# Patient Record
Sex: Male | Born: 1955
Health system: Southern US, Community
[De-identification: ages and names within clinical notes are randomized; demographics above are authoritative.]

## PROBLEM LIST (undated history)

## (undated) DIAGNOSIS — I10 Essential (primary) hypertension: Secondary | ICD-10-CM

## (undated) DIAGNOSIS — C801 Malignant (primary) neoplasm, unspecified: Secondary | ICD-10-CM

## (undated) DIAGNOSIS — I4892 Unspecified atrial flutter: Secondary | ICD-10-CM

## (undated) DIAGNOSIS — M1990 Unspecified osteoarthritis, unspecified site: Secondary | ICD-10-CM

## (undated) DIAGNOSIS — F32A Depression, unspecified: Secondary | ICD-10-CM

## (undated) DIAGNOSIS — F329 Major depressive disorder, single episode, unspecified: Secondary | ICD-10-CM

## (undated) HISTORY — PX: SKIN CANCER EXCISION: SHX779

## (undated) HISTORY — PX: COLONOSCOPY W/ POLYPECTOMY: SHX1380

---

## 2015-06-02 DIAGNOSIS — L570 Actinic keratosis: Secondary | ICD-10-CM | POA: Diagnosis not present

## 2015-06-02 DIAGNOSIS — C4441 Basal cell carcinoma of skin of scalp and neck: Secondary | ICD-10-CM | POA: Diagnosis not present

## 2015-06-02 DIAGNOSIS — C44319 Basal cell carcinoma of skin of other parts of face: Secondary | ICD-10-CM | POA: Diagnosis not present

## 2015-06-02 DIAGNOSIS — L301 Dyshidrosis [pompholyx]: Secondary | ICD-10-CM | POA: Diagnosis not present

## 2015-06-17 DIAGNOSIS — E291 Testicular hypofunction: Secondary | ICD-10-CM | POA: Diagnosis not present

## 2015-06-17 DIAGNOSIS — J019 Acute sinusitis, unspecified: Secondary | ICD-10-CM | POA: Diagnosis not present

## 2015-06-17 DIAGNOSIS — I10 Essential (primary) hypertension: Secondary | ICD-10-CM | POA: Diagnosis not present

## 2015-06-17 DIAGNOSIS — M5431 Sciatica, right side: Secondary | ICD-10-CM | POA: Diagnosis not present

## 2015-06-17 DIAGNOSIS — M545 Low back pain: Secondary | ICD-10-CM | POA: Diagnosis not present

## 2015-06-17 DIAGNOSIS — F329 Major depressive disorder, single episode, unspecified: Secondary | ICD-10-CM | POA: Diagnosis not present

## 2015-07-16 DIAGNOSIS — E291 Testicular hypofunction: Secondary | ICD-10-CM | POA: Diagnosis not present

## 2015-07-16 DIAGNOSIS — E782 Mixed hyperlipidemia: Secondary | ICD-10-CM | POA: Diagnosis not present

## 2015-07-16 DIAGNOSIS — R252 Cramp and spasm: Secondary | ICD-10-CM | POA: Diagnosis not present

## 2015-07-16 DIAGNOSIS — R5381 Other malaise: Secondary | ICD-10-CM | POA: Diagnosis not present

## 2015-07-16 DIAGNOSIS — E1165 Type 2 diabetes mellitus with hyperglycemia: Secondary | ICD-10-CM | POA: Diagnosis not present

## 2015-07-16 DIAGNOSIS — B179 Acute viral hepatitis, unspecified: Secondary | ICD-10-CM | POA: Diagnosis not present

## 2015-07-16 DIAGNOSIS — I10 Essential (primary) hypertension: Secondary | ICD-10-CM | POA: Diagnosis not present

## 2015-07-16 DIAGNOSIS — R947 Abnormal results of other endocrine function studies: Secondary | ICD-10-CM | POA: Diagnosis not present

## 2015-07-16 DIAGNOSIS — J019 Acute sinusitis, unspecified: Secondary | ICD-10-CM | POA: Diagnosis not present

## 2015-07-16 DIAGNOSIS — Z79891 Long term (current) use of opiate analgesic: Secondary | ICD-10-CM | POA: Diagnosis not present

## 2015-07-16 DIAGNOSIS — E790 Hyperuricemia without signs of inflammatory arthritis and tophaceous disease: Secondary | ICD-10-CM | POA: Diagnosis not present

## 2015-07-16 DIAGNOSIS — R7989 Other specified abnormal findings of blood chemistry: Secondary | ICD-10-CM | POA: Diagnosis not present

## 2015-07-16 DIAGNOSIS — F11220 Opioid dependence with intoxication, uncomplicated: Secondary | ICD-10-CM | POA: Diagnosis not present

## 2015-07-16 DIAGNOSIS — E039 Hypothyroidism, unspecified: Secondary | ICD-10-CM | POA: Diagnosis not present

## 2015-07-16 DIAGNOSIS — G8929 Other chronic pain: Secondary | ICD-10-CM | POA: Diagnosis not present

## 2015-08-13 DIAGNOSIS — G8929 Other chronic pain: Secondary | ICD-10-CM | POA: Diagnosis not present

## 2015-08-13 DIAGNOSIS — F11220 Opioid dependence with intoxication, uncomplicated: Secondary | ICD-10-CM | POA: Diagnosis not present

## 2015-08-13 DIAGNOSIS — E291 Testicular hypofunction: Secondary | ICD-10-CM | POA: Diagnosis not present

## 2015-08-13 DIAGNOSIS — F17210 Nicotine dependence, cigarettes, uncomplicated: Secondary | ICD-10-CM | POA: Diagnosis not present

## 2015-08-13 DIAGNOSIS — B1920 Unspecified viral hepatitis C without hepatic coma: Secondary | ICD-10-CM | POA: Diagnosis not present

## 2015-08-13 DIAGNOSIS — F1190 Opioid use, unspecified, uncomplicated: Secondary | ICD-10-CM | POA: Diagnosis not present

## 2015-08-13 DIAGNOSIS — I10 Essential (primary) hypertension: Secondary | ICD-10-CM | POA: Diagnosis not present

## 2015-08-13 DIAGNOSIS — Z79891 Long term (current) use of opiate analgesic: Secondary | ICD-10-CM | POA: Diagnosis not present

## 2015-09-11 DIAGNOSIS — W64XXXA Exposure to other animate mechanical forces, initial encounter: Secondary | ICD-10-CM | POA: Diagnosis not present

## 2015-09-11 DIAGNOSIS — E291 Testicular hypofunction: Secondary | ICD-10-CM | POA: Diagnosis not present

## 2015-09-11 DIAGNOSIS — F11220 Opioid dependence with intoxication, uncomplicated: Secondary | ICD-10-CM | POA: Diagnosis not present

## 2015-09-11 DIAGNOSIS — R5383 Other fatigue: Secondary | ICD-10-CM | POA: Diagnosis not present

## 2015-09-11 DIAGNOSIS — G8929 Other chronic pain: Secondary | ICD-10-CM | POA: Diagnosis not present

## 2015-09-11 DIAGNOSIS — F1190 Opioid use, unspecified, uncomplicated: Secondary | ICD-10-CM | POA: Diagnosis not present

## 2015-09-11 DIAGNOSIS — F17210 Nicotine dependence, cigarettes, uncomplicated: Secondary | ICD-10-CM | POA: Diagnosis not present

## 2015-09-11 DIAGNOSIS — I10 Essential (primary) hypertension: Secondary | ICD-10-CM | POA: Diagnosis not present

## 2015-09-11 DIAGNOSIS — Z79891 Long term (current) use of opiate analgesic: Secondary | ICD-10-CM | POA: Diagnosis not present

## 2015-09-17 DIAGNOSIS — A6920 Lyme disease, unspecified: Secondary | ICD-10-CM | POA: Diagnosis not present

## 2015-10-09 DIAGNOSIS — F11220 Opioid dependence with intoxication, uncomplicated: Secondary | ICD-10-CM | POA: Diagnosis not present

## 2015-10-09 DIAGNOSIS — I10 Essential (primary) hypertension: Secondary | ICD-10-CM | POA: Diagnosis not present

## 2015-10-09 DIAGNOSIS — Z5181 Encounter for therapeutic drug level monitoring: Secondary | ICD-10-CM | POA: Diagnosis not present

## 2015-10-09 DIAGNOSIS — R000 Tachycardia, unspecified: Secondary | ICD-10-CM | POA: Diagnosis not present

## 2015-10-09 DIAGNOSIS — I483 Typical atrial flutter: Secondary | ICD-10-CM | POA: Diagnosis not present

## 2015-10-09 DIAGNOSIS — F17210 Nicotine dependence, cigarettes, uncomplicated: Secondary | ICD-10-CM | POA: Diagnosis not present

## 2015-10-09 DIAGNOSIS — Z79891 Long term (current) use of opiate analgesic: Secondary | ICD-10-CM | POA: Diagnosis not present

## 2015-10-09 DIAGNOSIS — R002 Palpitations: Secondary | ICD-10-CM | POA: Diagnosis not present

## 2015-10-09 DIAGNOSIS — G8929 Other chronic pain: Secondary | ICD-10-CM | POA: Diagnosis not present

## 2015-10-13 DIAGNOSIS — E291 Testicular hypofunction: Secondary | ICD-10-CM | POA: Diagnosis not present

## 2015-11-13 DIAGNOSIS — G8929 Other chronic pain: Secondary | ICD-10-CM | POA: Diagnosis not present

## 2015-11-13 DIAGNOSIS — Z79891 Long term (current) use of opiate analgesic: Secondary | ICD-10-CM | POA: Diagnosis not present

## 2015-11-13 DIAGNOSIS — E291 Testicular hypofunction: Secondary | ICD-10-CM | POA: Diagnosis not present

## 2015-11-13 DIAGNOSIS — F1190 Opioid use, unspecified, uncomplicated: Secondary | ICD-10-CM | POA: Diagnosis not present

## 2015-11-13 DIAGNOSIS — I10 Essential (primary) hypertension: Secondary | ICD-10-CM | POA: Diagnosis not present

## 2015-11-13 DIAGNOSIS — F11220 Opioid dependence with intoxication, uncomplicated: Secondary | ICD-10-CM | POA: Diagnosis not present

## 2015-11-30 DIAGNOSIS — C44719 Basal cell carcinoma of skin of left lower limb, including hip: Secondary | ICD-10-CM | POA: Diagnosis not present

## 2015-11-30 DIAGNOSIS — L301 Dyshidrosis [pompholyx]: Secondary | ICD-10-CM | POA: Diagnosis not present

## 2015-11-30 DIAGNOSIS — L570 Actinic keratosis: Secondary | ICD-10-CM | POA: Diagnosis not present

## 2015-12-11 DIAGNOSIS — G8929 Other chronic pain: Secondary | ICD-10-CM | POA: Diagnosis not present

## 2015-12-11 DIAGNOSIS — Z5181 Encounter for therapeutic drug level monitoring: Secondary | ICD-10-CM | POA: Diagnosis not present

## 2015-12-11 DIAGNOSIS — I10 Essential (primary) hypertension: Secondary | ICD-10-CM | POA: Diagnosis not present

## 2015-12-11 DIAGNOSIS — R252 Cramp and spasm: Secondary | ICD-10-CM | POA: Diagnosis not present

## 2015-12-11 DIAGNOSIS — E559 Vitamin D deficiency, unspecified: Secondary | ICD-10-CM | POA: Diagnosis not present

## 2015-12-11 DIAGNOSIS — E790 Hyperuricemia without signs of inflammatory arthritis and tophaceous disease: Secondary | ICD-10-CM | POA: Diagnosis not present

## 2015-12-11 DIAGNOSIS — E782 Mixed hyperlipidemia: Secondary | ICD-10-CM | POA: Diagnosis not present

## 2015-12-11 DIAGNOSIS — E291 Testicular hypofunction: Secondary | ICD-10-CM | POA: Diagnosis not present

## 2015-12-11 DIAGNOSIS — E039 Hypothyroidism, unspecified: Secondary | ICD-10-CM | POA: Diagnosis not present

## 2015-12-11 DIAGNOSIS — F1190 Opioid use, unspecified, uncomplicated: Secondary | ICD-10-CM | POA: Diagnosis not present

## 2015-12-11 DIAGNOSIS — E1165 Type 2 diabetes mellitus with hyperglycemia: Secondary | ICD-10-CM | POA: Diagnosis not present

## 2016-01-08 DIAGNOSIS — F1190 Opioid use, unspecified, uncomplicated: Secondary | ICD-10-CM | POA: Diagnosis not present

## 2016-01-08 DIAGNOSIS — E291 Testicular hypofunction: Secondary | ICD-10-CM | POA: Diagnosis not present

## 2016-01-08 DIAGNOSIS — F17210 Nicotine dependence, cigarettes, uncomplicated: Secondary | ICD-10-CM | POA: Diagnosis not present

## 2016-01-08 DIAGNOSIS — F11220 Opioid dependence with intoxication, uncomplicated: Secondary | ICD-10-CM | POA: Diagnosis not present

## 2016-01-08 DIAGNOSIS — Z79891 Long term (current) use of opiate analgesic: Secondary | ICD-10-CM | POA: Diagnosis not present

## 2016-01-08 DIAGNOSIS — I10 Essential (primary) hypertension: Secondary | ICD-10-CM | POA: Diagnosis not present

## 2016-01-08 DIAGNOSIS — G8929 Other chronic pain: Secondary | ICD-10-CM | POA: Diagnosis not present

## 2016-02-08 DIAGNOSIS — I10 Essential (primary) hypertension: Secondary | ICD-10-CM | POA: Diagnosis not present

## 2016-02-08 DIAGNOSIS — G8929 Other chronic pain: Secondary | ICD-10-CM | POA: Diagnosis not present

## 2016-02-08 DIAGNOSIS — Z5181 Encounter for therapeutic drug level monitoring: Secondary | ICD-10-CM | POA: Diagnosis not present

## 2016-02-08 DIAGNOSIS — E291 Testicular hypofunction: Secondary | ICD-10-CM | POA: Diagnosis not present

## 2016-02-08 DIAGNOSIS — F1190 Opioid use, unspecified, uncomplicated: Secondary | ICD-10-CM | POA: Diagnosis not present

## 2016-03-07 DIAGNOSIS — F11220 Opioid dependence with intoxication, uncomplicated: Secondary | ICD-10-CM | POA: Diagnosis not present

## 2016-03-07 DIAGNOSIS — I10 Essential (primary) hypertension: Secondary | ICD-10-CM | POA: Diagnosis not present

## 2016-03-07 DIAGNOSIS — Z79891 Long term (current) use of opiate analgesic: Secondary | ICD-10-CM | POA: Diagnosis not present

## 2016-03-07 DIAGNOSIS — E291 Testicular hypofunction: Secondary | ICD-10-CM | POA: Diagnosis not present

## 2016-03-07 DIAGNOSIS — Z5181 Encounter for therapeutic drug level monitoring: Secondary | ICD-10-CM | POA: Diagnosis not present

## 2016-03-07 DIAGNOSIS — E559 Vitamin D deficiency, unspecified: Secondary | ICD-10-CM | POA: Diagnosis not present

## 2016-03-07 DIAGNOSIS — F1190 Opioid use, unspecified, uncomplicated: Secondary | ICD-10-CM | POA: Diagnosis not present

## 2016-03-07 DIAGNOSIS — E039 Hypothyroidism, unspecified: Secondary | ICD-10-CM | POA: Diagnosis not present

## 2016-03-07 DIAGNOSIS — E782 Mixed hyperlipidemia: Secondary | ICD-10-CM | POA: Diagnosis not present

## 2016-03-07 DIAGNOSIS — E1165 Type 2 diabetes mellitus with hyperglycemia: Secondary | ICD-10-CM | POA: Diagnosis not present

## 2016-03-07 DIAGNOSIS — E790 Hyperuricemia without signs of inflammatory arthritis and tophaceous disease: Secondary | ICD-10-CM | POA: Diagnosis not present

## 2016-03-07 DIAGNOSIS — G8929 Other chronic pain: Secondary | ICD-10-CM | POA: Diagnosis not present

## 2016-03-15 DIAGNOSIS — C44529 Squamous cell carcinoma of skin of other part of trunk: Secondary | ICD-10-CM | POA: Diagnosis not present

## 2016-03-15 DIAGNOSIS — L570 Actinic keratosis: Secondary | ICD-10-CM | POA: Diagnosis not present

## 2016-03-15 DIAGNOSIS — L301 Dyshidrosis [pompholyx]: Secondary | ICD-10-CM | POA: Diagnosis not present

## 2016-03-15 DIAGNOSIS — L578 Other skin changes due to chronic exposure to nonionizing radiation: Secondary | ICD-10-CM | POA: Diagnosis not present

## 2016-03-15 DIAGNOSIS — C44719 Basal cell carcinoma of skin of left lower limb, including hip: Secondary | ICD-10-CM | POA: Diagnosis not present

## 2016-03-24 DIAGNOSIS — C44519 Basal cell carcinoma of skin of other part of trunk: Secondary | ICD-10-CM | POA: Diagnosis not present

## 2016-03-29 DIAGNOSIS — I483 Typical atrial flutter: Secondary | ICD-10-CM | POA: Diagnosis not present

## 2016-03-29 DIAGNOSIS — I110 Hypertensive heart disease with heart failure: Secondary | ICD-10-CM | POA: Diagnosis not present

## 2016-04-05 DIAGNOSIS — I519 Heart disease, unspecified: Secondary | ICD-10-CM | POA: Diagnosis not present

## 2016-04-05 DIAGNOSIS — E291 Testicular hypofunction: Secondary | ICD-10-CM | POA: Diagnosis not present

## 2016-04-05 DIAGNOSIS — G8929 Other chronic pain: Secondary | ICD-10-CM | POA: Diagnosis not present

## 2016-04-05 DIAGNOSIS — F1190 Opioid use, unspecified, uncomplicated: Secondary | ICD-10-CM | POA: Diagnosis not present

## 2016-04-05 DIAGNOSIS — Z5181 Encounter for therapeutic drug level monitoring: Secondary | ICD-10-CM | POA: Diagnosis not present

## 2016-04-05 DIAGNOSIS — I10 Essential (primary) hypertension: Secondary | ICD-10-CM | POA: Diagnosis not present

## 2016-05-03 DIAGNOSIS — F1190 Opioid use, unspecified, uncomplicated: Secondary | ICD-10-CM | POA: Diagnosis not present

## 2016-05-03 DIAGNOSIS — I10 Essential (primary) hypertension: Secondary | ICD-10-CM | POA: Diagnosis not present

## 2016-05-03 DIAGNOSIS — Z5181 Encounter for therapeutic drug level monitoring: Secondary | ICD-10-CM | POA: Diagnosis not present

## 2016-05-03 DIAGNOSIS — E291 Testicular hypofunction: Secondary | ICD-10-CM | POA: Diagnosis not present

## 2016-05-03 DIAGNOSIS — G8929 Other chronic pain: Secondary | ICD-10-CM | POA: Diagnosis not present

## 2016-05-03 DIAGNOSIS — F11220 Opioid dependence with intoxication, uncomplicated: Secondary | ICD-10-CM | POA: Diagnosis not present

## 2016-05-03 DIAGNOSIS — Z79891 Long term (current) use of opiate analgesic: Secondary | ICD-10-CM | POA: Diagnosis not present

## 2016-05-20 DIAGNOSIS — J189 Pneumonia, unspecified organism: Secondary | ICD-10-CM | POA: Diagnosis not present

## 2016-06-02 DIAGNOSIS — I519 Heart disease, unspecified: Secondary | ICD-10-CM | POA: Diagnosis not present

## 2016-06-02 DIAGNOSIS — F17210 Nicotine dependence, cigarettes, uncomplicated: Secondary | ICD-10-CM | POA: Diagnosis not present

## 2016-06-02 DIAGNOSIS — E790 Hyperuricemia without signs of inflammatory arthritis and tophaceous disease: Secondary | ICD-10-CM | POA: Diagnosis not present

## 2016-06-02 DIAGNOSIS — E1165 Type 2 diabetes mellitus with hyperglycemia: Secondary | ICD-10-CM | POA: Diagnosis not present

## 2016-06-02 DIAGNOSIS — E039 Hypothyroidism, unspecified: Secondary | ICD-10-CM | POA: Diagnosis not present

## 2016-06-02 DIAGNOSIS — F11220 Opioid dependence with intoxication, uncomplicated: Secondary | ICD-10-CM | POA: Diagnosis not present

## 2016-06-02 DIAGNOSIS — E559 Vitamin D deficiency, unspecified: Secondary | ICD-10-CM | POA: Diagnosis not present

## 2016-06-02 DIAGNOSIS — F1190 Opioid use, unspecified, uncomplicated: Secondary | ICD-10-CM | POA: Diagnosis not present

## 2016-06-02 DIAGNOSIS — Z5181 Encounter for therapeutic drug level monitoring: Secondary | ICD-10-CM | POA: Diagnosis not present

## 2016-06-02 DIAGNOSIS — I10 Essential (primary) hypertension: Secondary | ICD-10-CM | POA: Diagnosis not present

## 2016-06-02 DIAGNOSIS — E782 Mixed hyperlipidemia: Secondary | ICD-10-CM | POA: Diagnosis not present

## 2016-06-02 DIAGNOSIS — Z79891 Long term (current) use of opiate analgesic: Secondary | ICD-10-CM | POA: Diagnosis not present

## 2016-06-02 DIAGNOSIS — E291 Testicular hypofunction: Secondary | ICD-10-CM | POA: Diagnosis not present

## 2016-06-02 DIAGNOSIS — G8929 Other chronic pain: Secondary | ICD-10-CM | POA: Diagnosis not present

## 2016-07-04 DIAGNOSIS — F1190 Opioid use, unspecified, uncomplicated: Secondary | ICD-10-CM | POA: Diagnosis not present

## 2016-07-04 DIAGNOSIS — Z79891 Long term (current) use of opiate analgesic: Secondary | ICD-10-CM | POA: Diagnosis not present

## 2016-07-04 DIAGNOSIS — I10 Essential (primary) hypertension: Secondary | ICD-10-CM | POA: Diagnosis not present

## 2016-07-04 DIAGNOSIS — Z5181 Encounter for therapeutic drug level monitoring: Secondary | ICD-10-CM | POA: Diagnosis not present

## 2016-07-04 DIAGNOSIS — G8929 Other chronic pain: Secondary | ICD-10-CM | POA: Diagnosis not present

## 2016-07-04 DIAGNOSIS — E291 Testicular hypofunction: Secondary | ICD-10-CM | POA: Diagnosis not present

## 2016-07-04 DIAGNOSIS — F11220 Opioid dependence with intoxication, uncomplicated: Secondary | ICD-10-CM | POA: Diagnosis not present

## 2016-07-04 DIAGNOSIS — F17210 Nicotine dependence, cigarettes, uncomplicated: Secondary | ICD-10-CM | POA: Diagnosis not present

## 2016-07-25 DIAGNOSIS — C44629 Squamous cell carcinoma of skin of left upper limb, including shoulder: Secondary | ICD-10-CM | POA: Diagnosis not present

## 2016-07-25 DIAGNOSIS — D0462 Carcinoma in situ of skin of left upper limb, including shoulder: Secondary | ICD-10-CM | POA: Diagnosis not present

## 2016-07-25 DIAGNOSIS — L570 Actinic keratosis: Secondary | ICD-10-CM | POA: Diagnosis not present

## 2016-08-01 DIAGNOSIS — M5441 Lumbago with sciatica, right side: Secondary | ICD-10-CM | POA: Diagnosis not present

## 2016-08-01 DIAGNOSIS — E291 Testicular hypofunction: Secondary | ICD-10-CM | POA: Diagnosis not present

## 2016-08-01 DIAGNOSIS — I10 Essential (primary) hypertension: Secondary | ICD-10-CM | POA: Diagnosis not present

## 2016-08-01 DIAGNOSIS — G8929 Other chronic pain: Secondary | ICD-10-CM | POA: Diagnosis not present

## 2016-08-01 DIAGNOSIS — Z5181 Encounter for therapeutic drug level monitoring: Secondary | ICD-10-CM | POA: Diagnosis not present

## 2016-08-01 DIAGNOSIS — F1190 Opioid use, unspecified, uncomplicated: Secondary | ICD-10-CM | POA: Diagnosis not present

## 2016-08-08 DIAGNOSIS — L570 Actinic keratosis: Secondary | ICD-10-CM | POA: Diagnosis not present

## 2016-08-29 DIAGNOSIS — C44629 Squamous cell carcinoma of skin of left upper limb, including shoulder: Secondary | ICD-10-CM | POA: Diagnosis not present

## 2016-09-01 DIAGNOSIS — E291 Testicular hypofunction: Secondary | ICD-10-CM | POA: Diagnosis not present

## 2016-09-01 DIAGNOSIS — F1190 Opioid use, unspecified, uncomplicated: Secondary | ICD-10-CM | POA: Diagnosis not present

## 2016-09-01 DIAGNOSIS — R05 Cough: Secondary | ICD-10-CM | POA: Diagnosis not present

## 2016-09-01 DIAGNOSIS — G8929 Other chronic pain: Secondary | ICD-10-CM | POA: Diagnosis not present

## 2016-09-01 DIAGNOSIS — E782 Mixed hyperlipidemia: Secondary | ICD-10-CM | POA: Diagnosis not present

## 2016-09-01 DIAGNOSIS — E039 Hypothyroidism, unspecified: Secondary | ICD-10-CM | POA: Diagnosis not present

## 2016-09-01 DIAGNOSIS — E790 Hyperuricemia without signs of inflammatory arthritis and tophaceous disease: Secondary | ICD-10-CM | POA: Diagnosis not present

## 2016-09-01 DIAGNOSIS — E1165 Type 2 diabetes mellitus with hyperglycemia: Secondary | ICD-10-CM | POA: Diagnosis not present

## 2016-09-01 DIAGNOSIS — F17210 Nicotine dependence, cigarettes, uncomplicated: Secondary | ICD-10-CM | POA: Diagnosis not present

## 2016-09-01 DIAGNOSIS — E559 Vitamin D deficiency, unspecified: Secondary | ICD-10-CM | POA: Diagnosis not present

## 2016-09-01 DIAGNOSIS — I10 Essential (primary) hypertension: Secondary | ICD-10-CM | POA: Diagnosis not present

## 2016-09-20 DIAGNOSIS — M549 Dorsalgia, unspecified: Secondary | ICD-10-CM | POA: Diagnosis not present

## 2016-09-20 DIAGNOSIS — M48062 Spinal stenosis, lumbar region with neurogenic claudication: Secondary | ICD-10-CM | POA: Diagnosis not present

## 2016-09-20 DIAGNOSIS — M4316 Spondylolisthesis, lumbar region: Secondary | ICD-10-CM | POA: Diagnosis not present

## 2016-09-20 DIAGNOSIS — I10 Essential (primary) hypertension: Secondary | ICD-10-CM | POA: Diagnosis not present

## 2016-10-10 DIAGNOSIS — F411 Generalized anxiety disorder: Secondary | ICD-10-CM | POA: Diagnosis not present

## 2016-10-10 DIAGNOSIS — I10 Essential (primary) hypertension: Secondary | ICD-10-CM | POA: Diagnosis not present

## 2016-10-10 DIAGNOSIS — G8929 Other chronic pain: Secondary | ICD-10-CM | POA: Diagnosis not present

## 2016-10-10 DIAGNOSIS — E785 Hyperlipidemia, unspecified: Secondary | ICD-10-CM | POA: Diagnosis not present

## 2016-10-10 DIAGNOSIS — F1190 Opioid use, unspecified, uncomplicated: Secondary | ICD-10-CM | POA: Diagnosis not present

## 2016-10-27 DIAGNOSIS — I10 Essential (primary) hypertension: Secondary | ICD-10-CM | POA: Diagnosis not present

## 2016-10-27 DIAGNOSIS — I4892 Unspecified atrial flutter: Secondary | ICD-10-CM | POA: Diagnosis not present

## 2016-10-31 DIAGNOSIS — L570 Actinic keratosis: Secondary | ICD-10-CM | POA: Diagnosis not present

## 2016-10-31 DIAGNOSIS — M48062 Spinal stenosis, lumbar region with neurogenic claudication: Secondary | ICD-10-CM | POA: Diagnosis not present

## 2016-10-31 DIAGNOSIS — M545 Low back pain: Secondary | ICD-10-CM | POA: Diagnosis not present

## 2016-10-31 DIAGNOSIS — M5136 Other intervertebral disc degeneration, lumbar region: Secondary | ICD-10-CM | POA: Diagnosis not present

## 2016-11-10 DIAGNOSIS — I10 Essential (primary) hypertension: Secondary | ICD-10-CM | POA: Diagnosis not present

## 2016-11-10 DIAGNOSIS — F17210 Nicotine dependence, cigarettes, uncomplicated: Secondary | ICD-10-CM | POA: Diagnosis not present

## 2016-11-10 DIAGNOSIS — F11220 Opioid dependence with intoxication, uncomplicated: Secondary | ICD-10-CM | POA: Diagnosis not present

## 2016-11-10 DIAGNOSIS — G8929 Other chronic pain: Secondary | ICD-10-CM | POA: Diagnosis not present

## 2016-11-10 DIAGNOSIS — E291 Testicular hypofunction: Secondary | ICD-10-CM | POA: Diagnosis not present

## 2016-11-10 DIAGNOSIS — Z79891 Long term (current) use of opiate analgesic: Secondary | ICD-10-CM | POA: Diagnosis not present

## 2016-11-10 DIAGNOSIS — F1190 Opioid use, unspecified, uncomplicated: Secondary | ICD-10-CM | POA: Diagnosis not present

## 2016-12-09 DIAGNOSIS — I519 Heart disease, unspecified: Secondary | ICD-10-CM | POA: Diagnosis not present

## 2016-12-09 DIAGNOSIS — G8929 Other chronic pain: Secondary | ICD-10-CM | POA: Diagnosis not present

## 2016-12-09 DIAGNOSIS — F1190 Opioid use, unspecified, uncomplicated: Secondary | ICD-10-CM | POA: Diagnosis not present

## 2016-12-09 DIAGNOSIS — Z5181 Encounter for therapeutic drug level monitoring: Secondary | ICD-10-CM | POA: Diagnosis not present

## 2016-12-09 DIAGNOSIS — F17210 Nicotine dependence, cigarettes, uncomplicated: Secondary | ICD-10-CM | POA: Diagnosis not present

## 2016-12-09 DIAGNOSIS — I10 Essential (primary) hypertension: Secondary | ICD-10-CM | POA: Diagnosis not present

## 2017-01-09 ENCOUNTER — Other Ambulatory Visit: Payer: Self-pay | Admitting: Neurosurgery

## 2017-01-11 DIAGNOSIS — G8929 Other chronic pain: Secondary | ICD-10-CM | POA: Diagnosis not present

## 2017-01-11 DIAGNOSIS — F17210 Nicotine dependence, cigarettes, uncomplicated: Secondary | ICD-10-CM | POA: Diagnosis not present

## 2017-01-11 DIAGNOSIS — E291 Testicular hypofunction: Secondary | ICD-10-CM | POA: Diagnosis not present

## 2017-01-11 DIAGNOSIS — F1190 Opioid use, unspecified, uncomplicated: Secondary | ICD-10-CM | POA: Diagnosis not present

## 2017-01-11 DIAGNOSIS — Z5181 Encounter for therapeutic drug level monitoring: Secondary | ICD-10-CM | POA: Diagnosis not present

## 2017-01-11 DIAGNOSIS — I10 Essential (primary) hypertension: Secondary | ICD-10-CM | POA: Diagnosis not present

## 2017-02-10 DIAGNOSIS — I10 Essential (primary) hypertension: Secondary | ICD-10-CM | POA: Diagnosis not present

## 2017-02-10 DIAGNOSIS — Z1389 Encounter for screening for other disorder: Secondary | ICD-10-CM | POA: Diagnosis not present

## 2017-02-10 DIAGNOSIS — Z5181 Encounter for therapeutic drug level monitoring: Secondary | ICD-10-CM | POA: Diagnosis not present

## 2017-02-10 DIAGNOSIS — E291 Testicular hypofunction: Secondary | ICD-10-CM | POA: Diagnosis not present

## 2017-02-10 DIAGNOSIS — F1190 Opioid use, unspecified, uncomplicated: Secondary | ICD-10-CM | POA: Diagnosis not present

## 2017-02-10 DIAGNOSIS — G8929 Other chronic pain: Secondary | ICD-10-CM | POA: Diagnosis not present

## 2017-02-10 DIAGNOSIS — F411 Generalized anxiety disorder: Secondary | ICD-10-CM | POA: Diagnosis not present

## 2017-02-10 DIAGNOSIS — F17210 Nicotine dependence, cigarettes, uncomplicated: Secondary | ICD-10-CM | POA: Diagnosis not present

## 2017-02-10 DIAGNOSIS — M5137 Other intervertebral disc degeneration, lumbosacral region: Secondary | ICD-10-CM | POA: Diagnosis not present

## 2017-02-22 NOTE — Pre-Procedure Instructions (Signed)
Bryan Robinson  02/22/2017     No Pharmacies Listed   Your procedure is scheduled on Wed. Oct. 10  Report to [facility] Admitting at 10:15 A.M.  Call this number if you have problems the morning of surgery:  [phone]   Remember:  Do not eat food or drink liquids after midnight on Oct. 9   Take these medicines the morning of surgery with A SIP OF WATER              7 days prior to surgery STOP taking any Aspirin (unless otherwise instructed by your surgeon), Aleve, Naproxen, Ibuprofen, Motrin, Advil, Goody's, BC's, all herbal medications, fish oil, and all vitamins    Do not wear jewelry.  Do not wear lotions, powders, or perfumes, or deoderant.  Do not shave 48 hours prior to surgery.  Men may shave face and neck.  Do not bring valuables to the hospital.  [facility] is not responsible for any belongings or valuables.  Contacts, dentures or bridgework may not be worn into surgery.  Leave your suitcase in the car.  After surgery it may be brought to your room.  For patients admitted to the hospital, discharge time will be determined by your treatment team.  Patients discharged the day of surgery will not be allowed to drive home.    Special instructions:  [place]- Preparing For Surgery  Before surgery, you can play an important role. Because skin is not sterile, your skin needs to be as free of germs as possible. You can reduce the number of germs on your skin by washing with CHG (chlorahexidine gluconate) Soap before surgery.  CHG is an antiseptic cleaner which kills germs and bonds with the skin to continue killing germs even after washing.  Please do not use if you have an allergy to CHG or antibacterial soaps. If your skin becomes reddened/irritated stop using the CHG.  Do not shave (including legs and underarms) for at least 48 hours prior to first CHG shower. It is OK to shave your face.  Please follow these instructions carefully.   1. Shower the  NIGHT BEFORE SURGERY and the MORNING OF SURGERY with CHG.   2. If you chose to wash your hair, wash your hair first as usual with your normal shampoo.  3. After you shampoo, rinse your hair and body thoroughly to remove the shampoo.  4. Use CHG as you would any other liquid soap. You can apply CHG directly to the skin and wash gently with a scrungie or a clean washcloth.   5. Apply the CHG Soap to your body ONLY FROM THE NECK DOWN.  Do not use on open wounds or open sores. Avoid contact with your eyes, ears, mouth and genitals (private parts). Wash genitals (private parts) with your normal soap.  USE REGULAR SHAMPOO AND CONDITIONER FOR HAIR USE REGULAR SOAP FOR FACE AND PRIVATE AREA  6. Wash thoroughly, paying special attention to the area where your surgery will be performed.  7. Thoroughly rinse your body with warm water from the neck down.  8. DO NOT shower/wash with your normal soap after using and rinsing off the CHG Soap.  9. Pat yourself dry with a CLEAN TOWEL and [place] CLOTH  10. Wear CLEAN PAJAMAS to bed the night before surgery, wear comfortable clothes the morning of surgery  11. Place CLEAN SHEETS on your bed the night of your first shower and DO NOT SLEEP WITH PETS.  Day of Surgery: Do not apply any deodorants/lotions. Please wear clean clothes to the hospital/surgery center.      Please read over the following fact sheets that you were given. Coughing and Deep Breathing, MRSA Information and Surgical Site Infection Prevention

## 2017-02-23 ENCOUNTER — Encounter (HOSPITAL_COMMUNITY)
Admission: RE | Admit: 2017-02-23 | Discharge: 2017-02-23 | Disposition: A | Discharge status: Home / Self Care | Payer: PPO | Source: Ambulatory Visit | Attending: Neurosurgery | Admitting: Neurosurgery

## 2017-02-23 ENCOUNTER — Encounter (HOSPITAL_COMMUNITY): Payer: Self-pay

## 2017-02-23 DIAGNOSIS — Z01812 Encounter for preprocedural laboratory examination: Secondary | ICD-10-CM | POA: Diagnosis not present

## 2017-02-23 DIAGNOSIS — F329 Major depressive disorder, single episode, unspecified: Secondary | ICD-10-CM | POA: Insufficient documentation

## 2017-02-23 DIAGNOSIS — I10 Essential (primary) hypertension: Secondary | ICD-10-CM | POA: Diagnosis not present

## 2017-02-23 DIAGNOSIS — Z85828 Personal history of other malignant neoplasm of skin: Secondary | ICD-10-CM | POA: Insufficient documentation

## 2017-02-23 DIAGNOSIS — M1990 Unspecified osteoarthritis, unspecified site: Secondary | ICD-10-CM | POA: Diagnosis not present

## 2017-02-23 DIAGNOSIS — M4316 Spondylolisthesis, lumbar region: Secondary | ICD-10-CM | POA: Diagnosis not present

## 2017-02-23 HISTORY — DX: Unspecified atrial flutter: I48.92

## 2017-02-23 HISTORY — DX: Depression, unspecified: F32.A

## 2017-02-23 HISTORY — DX: Malignant (primary) neoplasm, unspecified: C80.1

## 2017-02-23 HISTORY — DX: Essential (primary) hypertension: I10

## 2017-02-23 HISTORY — DX: Unspecified osteoarthritis, unspecified site: M19.90

## 2017-02-23 HISTORY — DX: Major depressive disorder, single episode, unspecified: F32.9

## 2017-02-23 LAB — TYPE AND SCREEN
ABO/RH(D): B POS
ANTIBODY SCREEN: NEGATIVE

## 2017-02-23 LAB — SURGICAL PCR SCREEN
MRSA, PCR: NEGATIVE
Staphylococcus aureus: NEGATIVE

## 2017-02-23 LAB — BASIC METABOLIC PANEL
Anion gap: 11 (ref 5–15)
BUN: 6 mg/dL (ref 6–20)
CHLORIDE: 97 mmol/L — AB (ref 101–111)
CO2: 27 mmol/L (ref 22–32)
CREATININE: 1.07 mg/dL (ref 0.61–1.24)
Calcium: 9.1 mg/dL (ref 8.9–10.3)
GFR calc Af Amer: >60 mL/min (ref >60)
GFR calc non Af Amer: >60 mL/min (ref >60)
Glucose, Bld: 92 mg/dL (ref 65–99)
POTASSIUM: 4.1 mmol/L (ref 3.5–5.1)
SODIUM: 135 mmol/L (ref 135–145)

## 2017-02-23 LAB — CBC
HEMATOCRIT: 43.7 % (ref 39.0–52.0)
HEMOGLOBIN: 14.9 g/dL (ref 13.0–17.0)
MCH: 30.3 pg (ref 26.0–34.0)
MCHC: 34.1 g/dL (ref 30.0–36.0)
MCV: 89 fL (ref 78.0–100.0)
Platelets: 209 10*3/uL (ref 150–400)
RBC: 4.91 MIL/uL (ref 4.22–5.81)
RDW: 14.2 % (ref 11.5–15.5)
WBC: 8.9 10*3/uL (ref 4.0–10.5)

## 2017-02-23 LAB — ABO/RH: ABO/RH(D): B POS

## 2017-02-23 NOTE — Pre-Procedure Instructions (Signed)
Margarita JERRI HARGADON  02/23/2017      [id number] - [place], [place] - [address] [place] [address] Phone: [phone] Fax: [phone]    Your procedure is scheduled on Wed. Oct. 10  Report to [facility] Admitting at 10:15 A.M.  Call this number if you have problems the morning of surgery:  [phone]   Remember:  Do not eat food or drink liquids after midnight on Oct. 9   Take these medicines the morning of surgery with A SIP OF WATER   Albuterol, citalopram, eye drops, metoprolol (toprol), oxycodone,              7 days prior to surgery STOP taking any Aspirin (unless otherwise instructed by your surgeon), Aleve, Naproxen, Ibuprofen, Motrin, Advil, Goody's, BC's, all herbal medications, fish oil, and all vitamins    Do not wear jewelry.  Do not wear lotions, powders, or perfumes, or deoderant.  Do not shave 48 hours prior to surgery.  Men may shave face and neck.  Do not bring valuables to the hospital.  [facility] is not responsible for any belongings or valuables.  Contacts, dentures or bridgework may not be worn into surgery.  Leave your suitcase in the car.  After surgery it may be brought to your room.  For patients admitted to the hospital, discharge time will be determined by your treatment team.  Patients discharged the day of surgery will not be allowed to drive home.    Special instructions:  [place]- Preparing For Surgery  Before surgery, you can play an important role. Because skin is not sterile, your skin needs to be as free of germs as possible. You can reduce the number of germs on your skin by washing with CHG (chlorahexidine gluconate) Soap before surgery.  CHG is an antiseptic cleaner which kills germs and bonds with the skin to continue killing germs even after washing.  Please do not use if you have an allergy to CHG or antibacterial soaps. If your skin becomes reddened/irritated stop using the CHG.   Do not shave (including legs and underarms) for at least 48 hours prior to first CHG shower. It is OK to shave your face.  Please follow these instructions carefully.   1. Shower the NIGHT BEFORE SURGERY and the MORNING OF SURGERY with CHG.   2. If you chose to wash your hair, wash your hair first as usual with your normal shampoo.  3. After you shampoo, rinse your hair and body thoroughly to remove the shampoo.  4. Use CHG as you would any other liquid soap. You can apply CHG directly to the skin and wash gently with a scrungie or a clean washcloth.   5. Apply the CHG Soap to your body ONLY FROM THE NECK DOWN.  Do not use on open wounds or open sores. Avoid contact with your eyes, ears, mouth and genitals (private parts). Wash genitals (private parts) with your normal soap.  USE REGULAR SHAMPOO AND CONDITIONER FOR HAIR USE REGULAR SOAP FOR FACE AND PRIVATE AREA  6. Wash thoroughly, paying special attention to the area where your surgery will be performed.  7. Thoroughly rinse your body with warm water from the neck down.  8. DO NOT shower/wash with your normal soap after using and rinsing off the CHG Soap.  9. Pat yourself dry with a CLEAN TOWEL and [place] CLOTH  10. Wear CLEAN PAJAMAS to bed the night before  surgery, wear comfortable clothes the morning of surgery  11. Place CLEAN SHEETS on your bed the night of your first shower and DO NOT SLEEP WITH PETS.    Day of Surgery: Do not apply any deodorants/lotions. Please wear clean clothes to the hospital/surgery center.      Please read over the following fact sheets that you were given. Coughing and Deep Breathing, MRSA Information and Surgical Site Infection Prevention

## 2017-02-23 NOTE — Progress Notes (Signed)
Requested ekg from Dr. Lear Ng .

## 2017-02-24 ENCOUNTER — Encounter (HOSPITAL_COMMUNITY): Payer: Self-pay

## 2017-02-24 NOTE — Progress Notes (Addendum)
Anesthesia Chart Review:  Pt is a 61 year old male scheduled for L4-5 PLIF, interbody prosthesis, posterior lateral arthrodesis, posterior instrumentation on 03/01/2017 with Newman Pies, MD  - Cardiologist is Shirlee More, MD, last office visit 10/27/16 with Danie Binder, MD  PMH includes:  Paroxysmal atrial flutter, HTN. Current smoker. BMI 26  Medications include: Albuterol, ASA 81 mg, Lipitor, lisinopril-HCTZ, metoprolol  BP (!) 147/71   Pulse (!) 59   Temp 36.6 C   Resp 20   Ht 6' (1.829 m)   Wt 190 lb 11.2 oz (86.5 kg)   SpO2 99%   BMI 25.86 kg/m   Preoperative labs reviewed.    EKG 03/29/16 ([place]): sinus bradycardia (53 bpm)   If no changes, I anticipate pt can proceed with surgery as scheduled.    Willeen Cass, FNP-BC [facility]/Anesthesiology Phone: [phone] 02/28/2017 11:56 AM

## 2017-03-01 ENCOUNTER — Inpatient Hospital Stay (HOSPITAL_COMMUNITY)
Admission: RE | Admit: 2017-03-01 | Discharge: 2017-03-02 | DRG: 455 | Disposition: A | Discharge status: Home / Self Care | Payer: PPO | Attending: Neurosurgery | Admitting: Neurosurgery

## 2017-03-01 ENCOUNTER — Inpatient Hospital Stay (HOSPITAL_COMMUNITY): Payer: PPO

## 2017-03-01 ENCOUNTER — Encounter (HOSPITAL_COMMUNITY): Payer: Self-pay

## 2017-03-01 ENCOUNTER — Inpatient Hospital Stay (HOSPITAL_COMMUNITY): Payer: PPO | Admitting: Anesthesiology

## 2017-03-01 ENCOUNTER — Inpatient Hospital Stay (HOSPITAL_COMMUNITY): Payer: PPO | Admitting: Emergency Medicine

## 2017-03-01 ENCOUNTER — Encounter (HOSPITAL_COMMUNITY)
Admission: RE | Disposition: A | Discharge status: Home / Self Care | Payer: Self-pay | Source: Home / Self Care | Attending: Neurosurgery

## 2017-03-01 DIAGNOSIS — Z79899 Other long term (current) drug therapy: Secondary | ICD-10-CM

## 2017-03-01 DIAGNOSIS — M5416 Radiculopathy, lumbar region: Secondary | ICD-10-CM | POA: Diagnosis not present

## 2017-03-01 DIAGNOSIS — F329 Major depressive disorder, single episode, unspecified: Secondary | ICD-10-CM | POA: Diagnosis not present

## 2017-03-01 DIAGNOSIS — M48062 Spinal stenosis, lumbar region with neurogenic claudication: Principal | ICD-10-CM | POA: Diagnosis present

## 2017-03-01 DIAGNOSIS — Z85828 Personal history of other malignant neoplasm of skin: Secondary | ICD-10-CM

## 2017-03-01 DIAGNOSIS — Z419 Encounter for procedure for purposes other than remedying health state, unspecified: Secondary | ICD-10-CM

## 2017-03-01 DIAGNOSIS — I10 Essential (primary) hypertension: Secondary | ICD-10-CM | POA: Diagnosis present

## 2017-03-01 DIAGNOSIS — M48061 Spinal stenosis, lumbar region without neurogenic claudication: Secondary | ICD-10-CM | POA: Diagnosis not present

## 2017-03-01 DIAGNOSIS — M4726 Other spondylosis with radiculopathy, lumbar region: Secondary | ICD-10-CM | POA: Diagnosis not present

## 2017-03-01 DIAGNOSIS — Z7982 Long term (current) use of aspirin: Secondary | ICD-10-CM | POA: Diagnosis not present

## 2017-03-01 DIAGNOSIS — M1991 Primary osteoarthritis, unspecified site: Secondary | ICD-10-CM | POA: Diagnosis not present

## 2017-03-01 DIAGNOSIS — F17200 Nicotine dependence, unspecified, uncomplicated: Secondary | ICD-10-CM | POA: Diagnosis not present

## 2017-03-01 DIAGNOSIS — M4316 Spondylolisthesis, lumbar region: Secondary | ICD-10-CM | POA: Diagnosis not present

## 2017-03-01 DIAGNOSIS — M4326 Fusion of spine, lumbar region: Secondary | ICD-10-CM | POA: Diagnosis not present

## 2017-03-01 SURGERY — POSTERIOR LUMBAR FUSION 1 LEVEL
Anesthesia: General

## 2017-03-01 MED ORDER — EPHEDRINE SULFATE-NACL 50-0.9 MG/10ML-% IV SOSY
PREFILLED_SYRINGE | INTRAVENOUS | Status: DC | PRN
  Administered 2017-03-01: 5 mg via INTRAVENOUS
  Administered 2017-03-01: 10 mg via INTRAVENOUS
  Administered 2017-03-01 (×2): 5 mg via INTRAVENOUS

## 2017-03-01 MED ORDER — HYDROMORPHONE HCL 1 MG/ML IJ SOLN
0.2500 mg | INTRAMUSCULAR | Status: DC | PRN
  Administered 2017-03-01 (×4): 0.5 mg via INTRAVENOUS

## 2017-03-01 MED ORDER — PROPOFOL 10 MG/ML IV BOLUS
INTRAVENOUS | Status: AC
  Filled 2017-03-01: qty 20

## 2017-03-01 MED ORDER — LISINOPRIL 20 MG PO TABS
20.0000 mg | ORAL_TABLET | Freq: Every day | ORAL | Status: DC
  Administered 2017-03-02: 20 mg via ORAL
  Filled 2017-03-01: qty 1

## 2017-03-01 MED ORDER — BUPIVACAINE-EPINEPHRINE (PF) 0.5% -1:200000 IJ SOLN
INTRAMUSCULAR | Status: AC
  Filled 2017-03-01: qty 30

## 2017-03-01 MED ORDER — BUPIVACAINE LIPOSOME 1.3 % IJ SUSP
20.0000 mL | Freq: Once | INTRAMUSCULAR | Status: DC
  Filled 2017-03-01: qty 20

## 2017-03-01 MED ORDER — SODIUM CHLORIDE 0.9% FLUSH
3.0000 mL | Freq: Two times a day (BID) | INTRAVENOUS | Status: DC
  Administered 2017-03-01: 3 mL via INTRAVENOUS

## 2017-03-01 MED ORDER — ALBUTEROL SULFATE (2.5 MG/3ML) 0.083% IN NEBU: 2.5000 mg | INHALATION_SOLUTION | Freq: Four times a day (QID) | RESPIRATORY_TRACT | Status: DC | PRN

## 2017-03-01 MED ORDER — HYDROMORPHONE HCL 1 MG/ML IJ SOLN
INTRAMUSCULAR | Status: AC
  Filled 2017-03-01: qty 1

## 2017-03-01 MED ORDER — SUGAMMADEX SODIUM 200 MG/2ML IV SOLN
INTRAVENOUS | Status: DC | PRN
  Administered 2017-03-01: 200 mg via INTRAVENOUS

## 2017-03-01 MED ORDER — LACTATED RINGERS IV SOLN
INTRAVENOUS | Status: DC | PRN
  Administered 2017-03-01 (×2): via INTRAVENOUS

## 2017-03-01 MED ORDER — HYPROMELLOSE (GONIOSCOPIC) 2.5 % OP SOLN: 1.0000 [drp] | Freq: Every day | OPHTHALMIC | Status: DC | PRN

## 2017-03-01 MED ORDER — ONDANSETRON HCL 4 MG/2ML IJ SOLN: 4.0000 mg | Freq: Four times a day (QID) | INTRAMUSCULAR | Status: DC | PRN

## 2017-03-01 MED ORDER — BISACODYL 10 MG RE SUPP: 10.0000 mg | Freq: Every day | RECTAL | Status: DC | PRN

## 2017-03-01 MED ORDER — METOPROLOL SUCCINATE ER 25 MG PO TB24
50.0000 mg | ORAL_TABLET | Freq: Every day | ORAL | Status: DC
  Administered 2017-03-02: 50 mg via ORAL
  Filled 2017-03-01: qty 2

## 2017-03-01 MED ORDER — THROMBIN 20000 UNITS EX SOLR
CUTANEOUS | Status: DC | PRN
  Administered 2017-03-01: 14:00:00 via TOPICAL

## 2017-03-01 MED ORDER — 0.9 % SODIUM CHLORIDE (POUR BTL) OPTIME
TOPICAL | Status: DC | PRN
  Administered 2017-03-01: 1000 mL

## 2017-03-01 MED ORDER — CITALOPRAM HYDROBROMIDE 40 MG PO TABS
40.0000 mg | ORAL_TABLET | Freq: Every day | ORAL | Status: DC
  Administered 2017-03-02: 40 mg via ORAL
  Filled 2017-03-01: qty 1

## 2017-03-01 MED ORDER — POLYVINYL ALCOHOL 1.4 % OP SOLN
1.0000 [drp] | OPHTHALMIC | Status: DC | PRN
  Filled 2017-03-01: qty 15

## 2017-03-01 MED ORDER — PHENYLEPHRINE HCL 10 MG/ML IJ SOLN
INTRAVENOUS | Status: DC | PRN
  Administered 2017-03-01: 25 ug/min via INTRAVENOUS

## 2017-03-01 MED ORDER — CEFAZOLIN SODIUM-DEXTROSE 2-4 GM/100ML-% IV SOLN
2.0000 g | INTRAVENOUS | Status: AC
  Administered 2017-03-01: 2 g via INTRAVENOUS
  Filled 2017-03-01: qty 100

## 2017-03-01 MED ORDER — OXYCODONE HCL 5 MG PO TABS: 5.0000 mg | ORAL_TABLET | Freq: Once | ORAL | Status: DC | PRN

## 2017-03-01 MED ORDER — CHLORHEXIDINE GLUCONATE CLOTH 2 % EX PADS: 6.0000 | MEDICATED_PAD | Freq: Once | CUTANEOUS | Status: DC

## 2017-03-01 MED ORDER — BACITRACIN ZINC 500 UNIT/GM EX OINT
TOPICAL_OINTMENT | CUTANEOUS | Status: DC | PRN
  Administered 2017-03-01: 1 via TOPICAL

## 2017-03-01 MED ORDER — LIDOCAINE 2% (20 MG/ML) 5 ML SYRINGE
INTRAMUSCULAR | Status: AC
  Filled 2017-03-01: qty 5

## 2017-03-01 MED ORDER — SODIUM CHLORIDE 0.9 % IR SOLN
Status: DC | PRN
  Administered 2017-03-01: 14:00:00

## 2017-03-01 MED ORDER — BUPIVACAINE LIPOSOME 1.3 % IJ SUSP
INTRAMUSCULAR | Status: DC | PRN
  Administered 2017-03-01: 20 mL

## 2017-03-01 MED ORDER — FENTANYL CITRATE (PF) 250 MCG/5ML IJ SOLN
INTRAMUSCULAR | Status: AC
  Filled 2017-03-01: qty 5

## 2017-03-01 MED ORDER — VANCOMYCIN HCL 1000 MG IV SOLR
INTRAVENOUS | Status: DC | PRN
  Administered 2017-03-01: 1000 mg via TOPICAL

## 2017-03-01 MED ORDER — PHENOL 1.4 % MT LIQD: 1.0000 | OROMUCOSAL | Status: DC | PRN

## 2017-03-01 MED ORDER — ONDANSETRON HCL 4 MG/2ML IJ SOLN
INTRAMUSCULAR | Status: AC
  Filled 2017-03-01: qty 2

## 2017-03-01 MED ORDER — SUGAMMADEX SODIUM 200 MG/2ML IV SOLN
INTRAVENOUS | Status: AC
  Filled 2017-03-01: qty 2

## 2017-03-01 MED ORDER — BACITRACIN ZINC 500 UNIT/GM EX OINT
TOPICAL_OINTMENT | CUTANEOUS | Status: AC
  Filled 2017-03-01: qty 28.35

## 2017-03-01 MED ORDER — SODIUM CHLORIDE 0.9% FLUSH: 3.0000 mL | INTRAVENOUS | Status: DC | PRN

## 2017-03-01 MED ORDER — GLYCOPYRROLATE 0.2 MG/ML IJ SOLN
INTRAMUSCULAR | Status: DC | PRN
  Administered 2017-03-01: 0.2 mg via INTRAVENOUS

## 2017-03-01 MED ORDER — MIDAZOLAM HCL 5 MG/5ML IJ SOLN
INTRAMUSCULAR | Status: DC | PRN
  Administered 2017-03-01: 2 mg via INTRAVENOUS

## 2017-03-01 MED ORDER — FENTANYL CITRATE (PF) 100 MCG/2ML IJ SOLN
INTRAMUSCULAR | Status: DC | PRN
  Administered 2017-03-01 (×2): 50 ug via INTRAVENOUS

## 2017-03-01 MED ORDER — DEXAMETHASONE SODIUM PHOSPHATE 10 MG/ML IJ SOLN
INTRAMUSCULAR | Status: DC | PRN
  Administered 2017-03-01: 10 mg via INTRAVENOUS

## 2017-03-01 MED ORDER — MENTHOL 3 MG MT LOZG
1.0000 | LOZENGE | OROMUCOSAL | Status: DC | PRN
  Administered 2017-03-02: 3 mg via ORAL
  Filled 2017-03-01: qty 9

## 2017-03-01 MED ORDER — VANCOMYCIN HCL 1000 MG IV SOLR
INTRAVENOUS | Status: AC
  Filled 2017-03-01: qty 1000

## 2017-03-01 MED ORDER — PROPOFOL 10 MG/ML IV BOLUS
INTRAVENOUS | Status: DC | PRN
  Administered 2017-03-01: 50 mg via INTRAVENOUS
  Administered 2017-03-01: 150 mg via INTRAVENOUS

## 2017-03-01 MED ORDER — ROCURONIUM BROMIDE 10 MG/ML (PF) SYRINGE
PREFILLED_SYRINGE | INTRAVENOUS | Status: AC
  Filled 2017-03-01: qty 5

## 2017-03-01 MED ORDER — DOCUSATE SODIUM 100 MG PO CAPS
100.0000 mg | ORAL_CAPSULE | Freq: Two times a day (BID) | ORAL | Status: DC
  Administered 2017-03-01 – 2017-03-02 (×2): 100 mg via ORAL
  Filled 2017-03-01 (×2): qty 1

## 2017-03-01 MED ORDER — DEXAMETHASONE SODIUM PHOSPHATE 10 MG/ML IJ SOLN
INTRAMUSCULAR | Status: AC
  Filled 2017-03-01: qty 1

## 2017-03-01 MED ORDER — BUPIVACAINE-EPINEPHRINE (PF) 0.5% -1:200000 IJ SOLN
INTRAMUSCULAR | Status: DC | PRN
  Administered 2017-03-01: 10 mL via PERINEURAL

## 2017-03-01 MED ORDER — EPHEDRINE 5 MG/ML INJ
INTRAVENOUS | Status: AC
  Filled 2017-03-01: qty 10

## 2017-03-01 MED ORDER — CYCLOBENZAPRINE HCL 10 MG PO TABS
10.0000 mg | ORAL_TABLET | Freq: Three times a day (TID) | ORAL | Status: DC | PRN
  Administered 2017-03-01: 10 mg via ORAL

## 2017-03-01 MED ORDER — VECURONIUM BROMIDE 10 MG IV SOLR
INTRAVENOUS | Status: DC | PRN
  Administered 2017-03-01: 2 mg via INTRAVENOUS

## 2017-03-01 MED ORDER — VECURONIUM BROMIDE 10 MG IV SOLR
INTRAVENOUS | Status: AC
  Filled 2017-03-01: qty 10

## 2017-03-01 MED ORDER — HYDROCHLOROTHIAZIDE 12.5 MG PO CAPS
12.5000 mg | ORAL_CAPSULE | Freq: Every day | ORAL | Status: DC
  Administered 2017-03-02: 12.5 mg via ORAL
  Filled 2017-03-01: qty 1

## 2017-03-01 MED ORDER — THROMBIN 20000 UNITS EX SOLR
CUTANEOUS | Status: AC
  Filled 2017-03-01: qty 20000

## 2017-03-01 MED ORDER — OXYCODONE HCL 5 MG PO TABS
ORAL_TABLET | ORAL | Status: AC
  Administered 2017-03-01: 30 mg via ORAL
  Filled 2017-03-01: qty 6

## 2017-03-01 MED ORDER — OXYCODONE HCL 5 MG/5ML PO SOLN: 5.0000 mg | Freq: Once | ORAL | Status: DC | PRN

## 2017-03-01 MED ORDER — OXYCODONE HCL 5 MG PO TABS
30.0000 mg | ORAL_TABLET | ORAL | Status: DC | PRN
  Administered 2017-03-01 – 2017-03-02 (×4): 30 mg via ORAL
  Filled 2017-03-01 (×3): qty 6

## 2017-03-01 MED ORDER — MORPHINE SULFATE (PF) 4 MG/ML IV SOLN: 4.0000 mg | INTRAVENOUS | Status: DC | PRN

## 2017-03-01 MED ORDER — LIDOCAINE HCL (CARDIAC) 20 MG/ML IV SOLN
INTRAVENOUS | Status: DC | PRN
  Administered 2017-03-01: 100 mg via INTRAVENOUS

## 2017-03-01 MED ORDER — ONDANSETRON HCL 4 MG PO TABS: 4.0000 mg | ORAL_TABLET | Freq: Four times a day (QID) | ORAL | Status: DC | PRN

## 2017-03-01 MED ORDER — ACETAMINOPHEN 650 MG RE SUPP: 650.0000 mg | RECTAL | Status: DC | PRN

## 2017-03-01 MED ORDER — ACETAMINOPHEN 325 MG PO TABS: 650.0000 mg | ORAL_TABLET | ORAL | Status: DC | PRN

## 2017-03-01 MED ORDER — CYCLOBENZAPRINE HCL 10 MG PO TABS
ORAL_TABLET | ORAL | Status: AC
  Administered 2017-03-01: 10 mg via ORAL
  Filled 2017-03-01: qty 1

## 2017-03-01 MED ORDER — ATORVASTATIN CALCIUM 20 MG PO TABS: 20.0000 mg | ORAL_TABLET | Freq: Every evening | ORAL | Status: DC

## 2017-03-01 MED ORDER — CEFAZOLIN SODIUM-DEXTROSE 2-4 GM/100ML-% IV SOLN
2.0000 g | Freq: Three times a day (TID) | INTRAVENOUS | Status: AC
  Administered 2017-03-02: 2 g via INTRAVENOUS
  Filled 2017-03-01: qty 100

## 2017-03-01 MED ORDER — MIDAZOLAM HCL 2 MG/2ML IJ SOLN
INTRAMUSCULAR | Status: AC
  Filled 2017-03-01: qty 2

## 2017-03-01 MED ORDER — LISINOPRIL-HYDROCHLOROTHIAZIDE 20-12.5 MG PO TABS: 1.0000 | ORAL_TABLET | Freq: Every day | ORAL | Status: DC

## 2017-03-01 MED ORDER — THROMBIN 5000 UNITS EX SOLR
OROMUCOSAL | Status: DC | PRN
  Administered 2017-03-01: 14:00:00 via TOPICAL

## 2017-03-01 MED ORDER — THROMBIN 5000 UNITS EX SOLR
CUTANEOUS | Status: AC
  Filled 2017-03-01: qty 5000

## 2017-03-01 MED ORDER — ROCURONIUM BROMIDE 100 MG/10ML IV SOLN
INTRAVENOUS | Status: DC | PRN
  Administered 2017-03-01: 70 mg via INTRAVENOUS
  Administered 2017-03-01: 30 mg via INTRAVENOUS

## 2017-03-01 SURGICAL SUPPLY — 71 items
BAG DECANTER FOR FLEXI CONT (MISCELLANEOUS) ×3 IMPLANT
BASKET BONE COLLECTION (BASKET) ×3 IMPLANT
BENZOIN TINCTURE PRP APPL 2/3 (GAUZE/BANDAGES/DRESSINGS) ×3 IMPLANT
BLADE CLIPPER SURG (BLADE) IMPLANT
BUR MATCHSTICK NEURO 3.0 LAGG (BURR) ×3 IMPLANT
BUR PRECISION FLUTE 6.0 (BURR) ×3 IMPLANT
CAGE ALTERA 10X31MM-10-14-15 (Cage) ×1 IMPLANT
CAGE ALTERA 10X31X10-14 15D (Cage) ×2 IMPLANT
CANISTER SUCT 3000ML PPV (MISCELLANEOUS) ×3 IMPLANT
CAP REVERE LOCKING (Cap) ×12 IMPLANT
CARTRIDGE OIL MAESTRO DRILL (MISCELLANEOUS) ×1 IMPLANT
CLOSURE WOUND 1/2 X4 (GAUZE/BANDAGES/DRESSINGS) ×1
CONT SPEC 4OZ CLIKSEAL STRL BL (MISCELLANEOUS) ×3 IMPLANT
COVER BACK TABLE 60X90IN (DRAPES) ×6 IMPLANT
DIFFUSER DRILL AIR PNEUMATIC (MISCELLANEOUS) ×3 IMPLANT
DRAPE C-ARM 42X72 X-RAY (DRAPES) ×6 IMPLANT
DRAPE HALF SHEET 40X57 (DRAPES) ×3 IMPLANT
DRAPE LAPAROTOMY 100X72X124 (DRAPES) ×3 IMPLANT
DRAPE POUCH INSTRU U-SHP 10X18 (DRAPES) ×3 IMPLANT
DRAPE SURG 17X23 STRL (DRAPES) ×12 IMPLANT
ELECT BLADE 4.0 EZ CLEAN MEGAD (MISCELLANEOUS) ×3
ELECT REM PT RETURN 9FT ADLT (ELECTROSURGICAL) ×3
ELECTRODE BLDE 4.0 EZ CLN MEGD (MISCELLANEOUS) ×1 IMPLANT
ELECTRODE REM PT RTRN 9FT ADLT (ELECTROSURGICAL) ×1 IMPLANT
EVACUATOR 1/8 PVC DRAIN (DRAIN) IMPLANT
GAUZE SPONGE 4X4 12PLY STRL (GAUZE/BANDAGES/DRESSINGS) ×3 IMPLANT
GAUZE SPONGE 4X4 16PLY XRAY LF (GAUZE/BANDAGES/DRESSINGS) ×3 IMPLANT
GLOVE BIO SURGEON STRL SZ8 (GLOVE) ×6 IMPLANT
GLOVE BIO SURGEON STRL SZ8.5 (GLOVE) ×6 IMPLANT
GLOVE BIOGEL PI IND STRL 7.0 (GLOVE) ×1 IMPLANT
GLOVE BIOGEL PI IND STRL 7.5 (GLOVE) ×1 IMPLANT
GLOVE BIOGEL PI INDICATOR 7.0 (GLOVE) ×2
GLOVE BIOGEL PI INDICATOR 7.5 (GLOVE) ×2
GLOVE ECLIPSE 7.0 STRL STRAW (GLOVE) ×6 IMPLANT
GLOVE ECLIPSE 7.5 STRL STRAW (GLOVE) ×9 IMPLANT
GLOVE EXAM NITRILE LRG STRL (GLOVE) IMPLANT
GLOVE EXAM NITRILE XL STR (GLOVE) IMPLANT
GLOVE EXAM NITRILE XS STR PU (GLOVE) IMPLANT
GOWN STRL REUS W/ TWL LRG LVL3 (GOWN DISPOSABLE) IMPLANT
GOWN STRL REUS W/ TWL XL LVL3 (GOWN DISPOSABLE) ×2 IMPLANT
GOWN STRL REUS W/TWL 2XL LVL3 (GOWN DISPOSABLE) IMPLANT
GOWN STRL REUS W/TWL LRG LVL3 (GOWN DISPOSABLE)
GOWN STRL REUS W/TWL XL LVL3 (GOWN DISPOSABLE) ×4
HEMOSTAT POWDER KIT SURGIFOAM (HEMOSTASIS) ×3 IMPLANT
KIT BASIN OR (CUSTOM PROCEDURE TRAY) ×3 IMPLANT
KIT ROOM TURNOVER OR (KITS) ×3 IMPLANT
MILL MEDIUM DISP (BLADE) ×3 IMPLANT
NEEDLE HYPO 21X1.5 SAFETY (NEEDLE) ×3 IMPLANT
NEEDLE HYPO 22GX1.5 SAFETY (NEEDLE) ×3 IMPLANT
NS IRRIG 1000ML POUR BTL (IV SOLUTION) ×3 IMPLANT
OIL CARTRIDGE MAESTRO DRILL (MISCELLANEOUS) ×3
PACK LAMINECTOMY NEURO (CUSTOM PROCEDURE TRAY) ×3 IMPLANT
PAD ARMBOARD 7.5X6 YLW CONV (MISCELLANEOUS) ×9 IMPLANT
PATTIES SURGICAL .5 X1 (DISPOSABLE) IMPLANT
ROD REVERE 6.35 45MM (Rod) ×6 IMPLANT
SCREW 7.5X45MM (Screw) ×9 IMPLANT
SCREW 7.5X50MM (Screw) ×3 IMPLANT
SPONGE LAP 4X18 X RAY DECT (DISPOSABLE) IMPLANT
SPONGE NEURO XRAY DETECT 1X3 (DISPOSABLE) IMPLANT
SPONGE SURGIFOAM ABS GEL 100 (HEMOSTASIS) ×3 IMPLANT
STRIP BIOACTIVE 20CC 25X100X8 (Miscellaneous) ×3 IMPLANT
STRIP CLOSURE SKIN 1/2X4 (GAUZE/BANDAGES/DRESSINGS) ×2 IMPLANT
SUT VIC AB 1 CT1 18XBRD ANBCTR (SUTURE) ×2 IMPLANT
SUT VIC AB 1 CT1 8-18 (SUTURE) ×4
SUT VIC AB 2-0 CP2 18 (SUTURE) ×6 IMPLANT
SYR 30ML LL (SYRINGE) ×3 IMPLANT
TAPE CLOTH SURG 6X10 WHT LF (GAUZE/BANDAGES/DRESSINGS) ×3 IMPLANT
TOWEL GREEN STERILE (TOWEL DISPOSABLE) ×3 IMPLANT
TOWEL GREEN STERILE FF (TOWEL DISPOSABLE) ×3 IMPLANT
TRAY FOLEY W/METER SILVER 16FR (SET/KITS/TRAYS/PACK) ×3 IMPLANT
WATER STERILE IRR 1000ML POUR (IV SOLUTION) ×3 IMPLANT

## 2017-03-01 NOTE — Anesthesia Procedure Notes (Addendum)
Procedure Name: Intubation Date/Time: 03/01/2017 1:01 PM Performed by: Rush Farmer E Pre-anesthesia Checklist: Patient identified, Emergency Drugs available, Suction available and Patient being monitored Patient Re-evaluated:Patient Re-evaluated prior to induction Oxygen Delivery Method: Circle system utilized Preoxygenation: Pre-oxygenation with 100% oxygen Induction Type: IV induction Ventilation: Mask ventilation without difficulty Laryngoscope Size: Mac and 4 Grade View: Grade I Tube type: Oral Tube size: 7.5 mm Number of attempts: 1 Airway Equipment and Method: Stylet Placement Confirmation: ETT inserted through vocal cords under direct vision,  positive ETCO2 and breath sounds checked- equal and bilateral Secured at: 21 cm Tube secured with: Tape Dental Injury: Teeth and Oropharynx as per pre-operative assessment

## 2017-03-01 NOTE — Op Note (Signed)
Brief history: The patient is a 61 year old white male who has had chronic back and leg pain consistent with neurogenic claudication. He has failed medical management and was worked up with a lumbar MRI and lumbar x-rays. This demonstrated an L4-5 spondylolisthesis with spinal stenosis. I discussed the various treatment options with the patient including surgery. He has weighed the risks, benefits, and alternatives to surgery and decided to proceed with an L4-5 decompression, instrumentation, and fusion.  Preoperative diagnosis: L4-5 spondylolisthesis, facet arthropathy,  spinal stenosis compressing both the L4 and the L5 nerve roots; lumbago; lumbar radiculopathy  Postoperative diagnosis: The same  Procedure: Bilateral L4-5 Laminotomy/foraminotomies to decompress the bilateral L4 and L5 nerve roots(the work required to do this was in addition to the work required to do the posterior lumbar interbody fusion because of the patient's spinal stenosis, facet arthropathy. Etc. requiring a wide decompression of the nerve roots.); L4-5 transforaminal lumbar interbody fusion with local morselized autograft bone and Kinnex graft extender; insertion of interbody prosthesis at L4-5 (globus peek expandable interbody prosthesis); posterior nonsegmental instrumentation from L4 to L5 with globus titanium pedicle screws and rods; posterior lateral arthrodesis at L4-5 with local morselized autograft bone and Kinnex bone graft extender.  Surgeon: Dr. Earle Gell  Asst.: Dr. Kathyrn Sheriff  Anesthesia: Gen. endotracheal  Estimated blood loss: 200 mL  Drains: None  Complications: None  Description of procedure: The patient was brought to the operating room by the anesthesia team. General endotracheal anesthesia was induced. The patient was turned to the prone position on the Wilson frame. The patient's lumbosacral region was then prepared with Betadine scrub and Betadine solution. Sterile drapes were applied.  I then  injected the area to be incised with Marcaine with epinephrine solution. I then used the scalpel to make a linear midline incision over the L4-5 interspace. I then used electrocautery to perform a bilateral subperiosteal dissection exposing the spinous process and lamina of L4 and L5. We then obtained intraoperative radiograph to confirm our location. We then inserted the Verstrac retractor to provide exposure.  I began the decompression by using the high speed drill to perform laminotomies at L4-5 bilaterally. We then used the Kerrison punches to widen the laminotomy and removed the ligamentum flavum at L4-5 bilaterally. We used the Kerrison punches to remove the medial facets at L4-5 bilaterally. We performed wide foraminotomies about the bilateral L4 and L5 nerve roots completing the decompression.  We now turned our attention to the posterior lumbar interbody fusion. I used a scalpel to incise the intervertebral disc at L4-5 bilaterally. I then performed a partial intervertebral discectomy at L4-5 bilaterally using the pituitary forceps. We prepared the vertebral endplates at O3-5 bilaterally for the fusion by removing the soft tissues with the curettes. We then used the trial spacers to pick the appropriate sized interbody prosthesis. We prefilled his prosthesis with a combination of local morselized autograft bone that we obtained during the decompression as well as Kinnex bone graft extender. We inserted the prefilled prosthesis into the interspace at L4-5, we then expanded the prosthesis. There was a good snug fit of the prosthesis in the interspace. We then filled and the remainder of the intervertebral disc space with local morselized autograft bone and Kinnex. This completed the posterior lumbar interbody arthrodesis.  We now turned attention to the instrumentation. Under fluoroscopic guidance we cannulated the bilateral L4 and L5 pedicles with the bone probe. We then removed the bone probe. We then  tapped the pedicle with a 6.5 millimeter  tap. We then removed the tap. We probed inside the tapped pedicle with a ball probe to rule out cortical breaches. We then inserted a 7.5 x 45 and 50 millimeter pedicle screw into the L4 and L5 pedicles bilaterally under fluoroscopic guidance. We then palpated along the medial aspect of the pedicles to rule out cortical breaches. There were none. The nerve roots were not injured. We then connected the unilateral pedicle screws with a lordotic rod. We compressed the construct and secured the rod in place with the caps. We then tightened the caps appropriately. This completed the instrumentation from L4-5.  We now turned our attention to the posterior lateral arthrodesis at L4-5 bilaterally. We used the high-speed drill to decorticate the remainder of the facets, pars, transverse process at L4-5 bilaterally. We then applied a combination of local morselized autograft bone and Kinnex bone graft extender over these decorticated posterior lateral structures. This completed the posterior lateral arthrodesis.  We then obtained hemostasis using bipolar electrocautery. We irrigated the wound out with bacitracin solution. We inspected the thecal sac and nerve roots and noted they were well decompressed. We then removed the retractor. We placed vancomycin powder in the wound. We reapproximated patient's thoracolumbar fascia with interrupted #1 Vicryl suture. We reapproximated patient's subcutaneous tissue with interrupted 2-0 Vicryl suture. The reapproximated patient's skin with Steri-Strips and benzoin. The wound was then coated with bacitracin ointment. A sterile dressing was applied. The drapes were removed. The patient was subsequently returned to the supine position where they were extubated by the anesthesia team. He was then transported to the post anesthesia care unit in stable condition. All sponge instrument and needle counts were reportedly correct at the end of this  case.

## 2017-03-01 NOTE — Anesthesia Preprocedure Evaluation (Addendum)
Anesthesia Evaluation  Patient identified by MRN, date of birth, ID band Patient awake    Reviewed: Allergy & Precautions, H&P , NPO status , Patient's Chart, lab work & pertinent test results  Airway Mallampati: I   Neck ROM: full    Dental  (+) Partial Upper   Pulmonary Current Smoker,    breath sounds clear to auscultation       Cardiovascular hypertension,  Rhythm:regular Rate:Normal     Neuro/Psych PSYCHIATRIC DISORDERS Depression    GI/Hepatic   Endo/Other    Renal/GU      Musculoskeletal  (+) Arthritis ,   Abdominal   Peds  Hematology   Anesthesia Other Findings   Reproductive/Obstetrics                            Anesthesia Physical Anesthesia Plan  ASA: II  Anesthesia Plan: General   Post-op Pain Management:    Induction: Intravenous  PONV Risk Score and Plan: 1 and Ondansetron, Dexamethasone, Midazolam and Treatment may vary due to age or medical condition  Airway Management Planned: Oral ETT  Additional Equipment:   Intra-op Plan:   Post-operative Plan: Extubation in OR  Informed Consent: I have reviewed the patients History and Physical, chart, labs and discussed the procedure including the risks, benefits and alternatives for the proposed anesthesia with the patient or authorized representative who has indicated his/her understanding and acceptance.     Plan Discussed with: CRNA, Anesthesiologist and Surgeon  Anesthesia Plan Comments:         Anesthesia Quick Evaluation

## 2017-03-01 NOTE — Progress Notes (Signed)
Subjective:  The patient is somewhat but arousable. He is in no apparent distress.  Objective: Vital signs in last 24 hours: Temp:  [98.5 F (36.9 C)] 98.5 F (36.9 C) (10/10 1018) Pulse Rate:  [59] 59 (10/10 1018) Resp:  [18] 18 (10/10 1018) BP: (165)/(80) 165/80 (10/10 1018) SpO2:  [96 %] 96 % (10/10 1018) Weight:  [86.5 kg (190 lb 11.2 oz)] 86.5 kg (190 lb 11.2 oz) (10/10 1031)  Intake/Output from previous day: No intake/output data recorded. Intake/Output this shift: Total I/O In: 1000 [I.V.:1000] Out: 425 [Urine:225; Blood:200]  Physical exam the patient is somewhat but arousable. He is moving his lower extremities well.  Lab Results: No results for input(s): WBC, HGB, HCT, PLT in the last 72 hours. BMET No results for input(s): NA, K, CL, CO2, GLUCOSE, BUN, CREATININE, CALCIUM in the last 72 hours.  Studies/Results: Dg Lumbar Spine 2-3 Views  Result Date: 03/01/2017 CLINICAL DATA:  Status post surgical posterior fusion of L4-5. EXAM: DG C-ARM 61-120 MIN; LUMBAR SPINE - 2-3 VIEW FLUOROSCOPY TIME:  25 seconds. COMPARISON:  Radiograph of same day. FINDINGS: Two intraoperative fluoroscopic images of the lower lumbar spine were obtained. These images demonstrate the patient be status post surgical posterior fusion of L4-5 with bilateral intrapedicular screw placement and interbody fusion. Good alignment of vertebral bodies is noted. IMPRESSION: Status post surgical posterior fusion of L4-5. Electronically Signed   By: Marijo Conception, M.D.   On: 03/01/2017 16:06   Dg Lumbar Spine 1 View  Result Date: 03/01/2017 CLINICAL DATA:  61 year old male with a history of lumbar fusion. EXAM: LUMBAR SPINE - 1 VIEW COMPARISON:  None. FINDINGS: Limited images during lumbar fusion. Cross-table lateral demonstrates tissue re- tractor overlying the L5 spinous process with surgical probe at the level of the L5 pedicle. IMPRESSION: Single cross-table lateral of the lumbar spine during surgery,  with the surgical probe identifying the L5 pedicle as above. Please refer to the dictated operative report for full details of intraoperative findings and procedure. Electronically Signed   By: Corrie Mckusick D.O.   On: 03/01/2017 13:54   Dg C-arm 1-60 Min  Result Date: 03/01/2017 CLINICAL DATA:  Status post surgical posterior fusion of L4-5. EXAM: DG C-ARM 61-120 MIN; LUMBAR SPINE - 2-3 VIEW FLUOROSCOPY TIME:  25 seconds. COMPARISON:  Radiograph of same day. FINDINGS: Two intraoperative fluoroscopic images of the lower lumbar spine were obtained. These images demonstrate the patient be status post surgical posterior fusion of L4-5 with bilateral intrapedicular screw placement and interbody fusion. Good alignment of vertebral bodies is noted. IMPRESSION: Status post surgical posterior fusion of L4-5. Electronically Signed   By: Marijo Conception, M.D.   On: 03/01/2017 16:06    Assessment/Plan: The patient is doing well.  LOS: 0 days     Shadell Brenn D 03/01/2017, 4:24 PM

## 2017-03-01 NOTE — Transfer of Care (Signed)
Immediate Anesthesia Transfer of Care Note  Patient: Bryan Robinson  Procedure(s) Performed: POSTERIOR LUMBAR INTERBODY FUSION, INTERBODY PROSTHESIS, POSTERIOR LATERAL ARTHRODESIS, POSTERIOR INSTRUMENTATION LUMBAR FOUR- LUMBAR FIVE (N/A )  Patient Location: PACU  Anesthesia Type:General  Level of Consciousness: awake and alert   Airway & Oxygen Therapy: Patient Spontanous Breathing and Patient connected to face mask oxygen  Post-op Assessment: Report given to RN and Post -op Vital signs reviewed and stable  Post vital signs: Reviewed and stable  Last Vitals:  Vitals:   03/01/17 1018  BP: (!) 165/80  Pulse: (!) 59  Resp: 18  Temp: 36.9 C  SpO2: 96%    Last Pain:  Vitals:   03/01/17 1031  TempSrc:   PainSc: 4       Patients Stated Pain Goal: 3 (21/03/12 8118)  Complications: No apparent anesthesia complications

## 2017-03-01 NOTE — Anesthesia Postprocedure Evaluation (Signed)
Anesthesia Post Note  Patient: Elmore H Dimock  Procedure(s) Performed: POSTERIOR LUMBAR INTERBODY FUSION, INTERBODY PROSTHESIS, POSTERIOR LATERAL ARTHRODESIS, POSTERIOR INSTRUMENTATION LUMBAR FOUR- LUMBAR FIVE (N/A )     Patient location during evaluation: PACU Anesthesia Type: General Level of consciousness: awake Pain management: pain level controlled Vital Signs Assessment: post-procedure vital signs reviewed and stable Respiratory status: spontaneous breathing Cardiovascular status: stable Anesthetic complications: no    Last Vitals:  Vitals:   03/01/17 1715 03/01/17 1730  BP: 124/80 126/65  Pulse: 89 85  Resp: 12 12  Temp:    SpO2: 99% 97%    Last Pain:  Vitals:   03/01/17 1730  TempSrc:   PainSc: 8       LLE Sensation: Full sensation;No numbness;No tingling (03/01/17 1730)   RLE Sensation: Full sensation;No numbness;No tingling (03/01/17 1730)      Elleni Mozingo

## 2017-03-01 NOTE — H&P (Signed)
Subjective: The patient is a 61 year old white male who has complained of back, buttock and leg pain consistent with neurogenic claudication. He has failed medical management and was worked up with lumbar x-rays of the lumbar MRI. This demonstrated an L4-5 spondylolisthesis with spinal stenosis. I discussed the various treatment options with the patient including surgery. He has weighed the risks, benefits, and alternative surgery decided proceed with an L4-5 decompression, instrumentation, and fusion.   Past Medical History:  Diagnosis Date  . Arthritis   . Atrial flutter, paroxysmal ([place])   . Cancer ([place])    skin cancers  . Depression   . Hypertension     Past Surgical History:  Procedure Laterality Date  . COLONOSCOPY W/ POLYPECTOMY    . SKIN CANCER EXCISION     nose    No Known Allergies  Social History  Substance Use Topics  . Smoking status: Current Every Day Smoker  . Smokeless tobacco: Not on file  . Alcohol use No    No family history on file. Prior to Admission medications   Medication Sig Start Date End Date Taking? Authorizing Provider  albuterol (PROVENTIL) (2.5 MG/3ML) 0.083% nebulizer solution Take 2.5 mg by nebulization every 6 (six) hours as needed for wheezing or shortness of breath.   Yes [provider]  aspirin EC 81 MG tablet Take 81 mg by mouth daily. 03/29/16 03/29/17 Yes [provider]  atorvastatin (LIPITOR) 20 MG tablet Take 20 mg by mouth daily. 10/10/16  Yes [provider]  citalopram (CELEXA) 40 MG tablet Take 40 mg by mouth daily. 10/03/16  Yes [provider]  hydroxypropyl methylcellulose / hypromellose (ISOPTO TEARS / GONIOVISC) 2.5 % ophthalmic solution Place 1 drop into both eyes daily as needed for dry eyes.   Yes [provider]  lisinopril-hydrochlorothiazide (PRINZIDE,ZESTORETIC) 20-12.5 MG tablet Take 1 tablet by mouth daily. 10/03/16  Yes [provider]  metoprolol succinate (TOPROL-XL)  50 MG 24 hr tablet Take 50 mg by mouth daily. 03/29/16  Yes [provider]  oxycodone (ROXICODONE) 30 MG immediate release tablet Take 30 mg by mouth every 6 (six) hours as needed for pain.   Yes [provider]  testosterone cypionate (DEPOTESTOSTERONE CYPIONATE) 200 MG/ML injection Inject 1 mL into the muscle once a week. 01/11/17  Yes [provider]     Review of Systems  Positive ROS: As above  All other systems have been reviewed and were otherwise negative with the exception of those mentioned in the HPI and as above.  Objective: Vital signs in last 24 hours: Temp:  [98.5 F (36.9 C)] 98.5 F (36.9 C) (10/10 1018) Pulse Rate:  [59] 59 (10/10 1018) Resp:  [18] 18 (10/10 1018) BP: (165)/(80) 165/80 (10/10 1018) SpO2:  [96 %] 96 % (10/10 1018) Weight:  [86.5 kg (190 lb 11.2 oz)] 86.5 kg (190 lb 11.2 oz) (10/10 1018)  General Appearance: Alert Head: Normocephalic, without obvious abnormality, atraumatic Eyes: PERRL, conjunctiva/corneas clear, EOM's intact,    Ears: Normal  Throat: Normal  Neck: Supple, Back: unremarkable Lungs: Clear to auscultation bilaterally, respirations unlabored Heart: Regular rate and rhythm, no murmur, rub or gallop Abdomen: Soft, non-tender Extremities: Extremities normal, atraumatic, no cyanosis or edema Skin: unremarkable  NEUROLOGIC:   Mental status: alert and oriented, Motor Exam - grossly normal, except he has weakness in his right gastrocnemius and dorsiflexors. Sensory Exam - grossly normal Reflexes:  Coordination - grossly normal Gait - grossly normal Balance - grossly normal Cranial Nerves:  I: smell Not tested  II: visual acuity  OS: Normal  OD: Normal   II: visual fields Full to confrontation  II: pupils Equal, round, reactive to light  III,VII: ptosis None  III,IV,VI: extraocular muscles  Full ROM  V: mastication Normal  V: facial light touch sensation  Normal  V,VII: corneal reflex  Present  VII:  facial muscle function - upper  Normal  VII: facial muscle function - lower Normal  VIII: hearing Not tested  IX: soft palate elevation  Normal  IX,X: gag reflex Present  XI: trapezius strength  5/5  XI: sternocleidomastoid strength 5/5  XI: neck flexion strength  5/5  XII: tongue strength  Normal    Data Review Lab Results  Component Value Date   WBC 8.9 02/23/2017   HGB 14.9 02/23/2017   HCT 43.7 02/23/2017   MCV 89.0 02/23/2017   PLT 209 02/23/2017   Lab Results  Component Value Date   NA 135 02/23/2017   K 4.1 02/23/2017   CL 97 (L) 02/23/2017   CO2 27 02/23/2017   BUN 6 02/23/2017   CREATININE 1.07 02/23/2017   GLUCOSE 92 02/23/2017   No results found for: INR, PROTIME  Assessment/Plan: L4-5 spondylolisthesis, spinal stenosis, lumbago, lumbar radiculopathy, neurogenic claudication: I have discussed situation with the patient. I have reviewed his imaging studies with him and pointed out the abnormalities. We have discussed the various treatment options including surgery. I have described a treatment option of an L4-5 decompression, instrumentation, and fusion. I have given him a surgical pamphlet. We have discussed the risks, benefits, alternatives, expected postoperative course, and likelihood of achieving our goals with surgery. I have answered all the patient's questions. He has decided to proceed with surgery.   Khristen Cheyney D 03/01/2017 10:28 AM

## 2017-03-02 LAB — CBC
HCT: 39.9 % (ref 39.0–52.0)
HEMOGLOBIN: 13.3 g/dL (ref 13.0–17.0)
MCH: 29.4 pg (ref 26.0–34.0)
MCHC: 33.3 g/dL (ref 30.0–36.0)
MCV: 88.1 fL (ref 78.0–100.0)
Platelets: 204 10*3/uL (ref 150–400)
RBC: 4.53 MIL/uL (ref 4.22–5.81)
RDW: 14.5 % (ref 11.5–15.5)
WBC: 18.3 10*3/uL — ABNORMAL HIGH (ref 4.0–10.5)

## 2017-03-02 LAB — BASIC METABOLIC PANEL
Anion gap: 10 (ref 5–15)
BUN: 8 mg/dL (ref 6–20)
CHLORIDE: 97 mmol/L — AB (ref 101–111)
CO2: 27 mmol/L (ref 22–32)
Calcium: 8.8 mg/dL — ABNORMAL LOW (ref 8.9–10.3)
Creatinine, Ser: 0.93 mg/dL (ref 0.61–1.24)
GFR calc Af Amer: >60 mL/min (ref >60)
GFR calc non Af Amer: >60 mL/min (ref >60)
GLUCOSE: 127 mg/dL — AB (ref 65–99)
POTASSIUM: 4 mmol/L (ref 3.5–5.1)
Sodium: 134 mmol/L — ABNORMAL LOW (ref 135–145)

## 2017-03-02 MED ORDER — OXYCODONE HCL 30 MG PO TABS: 30.0000 mg | ORAL_TABLET | ORAL | 0 refills | Status: DC | PRN

## 2017-03-02 MED ORDER — CYCLOBENZAPRINE HCL 10 MG PO TABS: 10.0000 mg | ORAL_TABLET | Freq: Three times a day (TID) | ORAL | 1 refills | Status: DC | PRN

## 2017-03-02 MED ORDER — DOCUSATE SODIUM 100 MG PO CAPS: 100.0000 mg | ORAL_CAPSULE | Freq: Two times a day (BID) | ORAL | 0 refills | Status: DC

## 2017-03-02 NOTE — Discharge Summary (Signed)
Physician Discharge Summary  Patient ID: Bryan Robinson MRN: [id number] DOB/AGE: February 12, 1956 61 y.o.  Admit date: 03/01/2017 Discharge date: 03/02/2017  Admission Diagnoses:L4-5 spondylolisthesis, spinal stenosis, lumbago, lumbar radiculopathy, neurogenic claudication  Discharge Diagnoses: The same Active Problems:   Spondylolisthesis of lumbar region   Discharged Condition: good  Hospital Course: I performed an L4-5 decompression, instrumentation, and fusion on the patient on 03/01/2017. The surgery went well.  The patient's postoperative course was unremarkable. The plan is to send him home later if he mobilizes well with physical therapy. He was given written and oral discharge instructions. All his questions were answered.  Consults: Physical therapy Significant Diagnostic Studies: None Treatments: L4-5 decompression, instrumentation, and fusion. Discharge Exam: Blood pressure 124/65, pulse (!) 57, temperature 98 F (36.7 C), temperature source Oral, resp. rate 20, height 6' (1.829 m), weight 86.5 kg (190 lb 11.2 oz), SpO2 97 %. The patient is alert and pleasant. He looks well. His strength is normal in his lower extremities.  Disposition: Home   Allergies as of 03/02/2017   No Known Allergies     Medication List    TAKE these medications   albuterol (2.5 MG/3ML) 0.083% nebulizer solution Commonly known as:  PROVENTIL Take 2.5 mg by nebulization every 6 (six) hours as needed for wheezing or shortness of breath.   aspirin EC 81 MG tablet Take 81 mg by mouth daily.   atorvastatin 20 MG tablet Commonly known as:  LIPITOR Take 20 mg by mouth daily.   citalopram 40 MG tablet Commonly known as:  CELEXA Take 40 mg by mouth daily.   cyclobenzaprine 10 MG tablet Commonly known as:  FLEXERIL Take 1 tablet (10 mg total) by mouth 3 (three) times daily as needed for muscle spasms.   docusate sodium 100 MG capsule Commonly known as:  COLACE Take 1 capsule (100 mg total)  by mouth 2 (two) times daily.   hydroxypropyl methylcellulose / hypromellose 2.5 % ophthalmic solution Commonly known as:  ISOPTO TEARS / GONIOVISC Place 1 drop into both eyes daily as needed for dry eyes.   lisinopril-hydrochlorothiazide 20-12.5 MG tablet Commonly known as:  PRINZIDE,ZESTORETIC Take 1 tablet by mouth daily.   metoprolol succinate 50 MG 24 hr tablet Commonly known as:  TOPROL-XL Take 50 mg by mouth daily.   oxycodone 30 MG immediate release tablet Commonly known as:  ROXICODONE Take 1 tablet (30 mg total) by mouth every 4 (four) hours as needed for severe pain. What changed:  when to take this  reasons to take this   testosterone cypionate 200 MG/ML injection Commonly known as:  DEPOTESTOSTERONE CYPIONATE Inject 1 mL into the muscle once a week.        SignedNewman Pies D 03/02/2017, 7:45 AM

## 2017-03-02 NOTE — Progress Notes (Signed)
Pt doing well. Pt given D/C instructions with Rx's, verbal understanding was provided. Pt's incision is clean and dry with no sign of infection. Pt's IV was removed prior to D/C. Pt D/C'd home via wheelchair per MD order. Pt is stable @ D/C and has no other needs at this time. Diarra Ceja, RN  °

## 2017-03-02 NOTE — Evaluation (Signed)
Physical Therapy Evaluation Patient Details Name: Bryan Robinson MRN: [id number] DOB: Jun 01, 1955 Today's Date: 03/02/2017   History of Present Illness  Pt is a 61 y/o male who presents s/p L4-L5 PLIF on 03/01/17.  Clinical Impression  Pt admitted with above diagnosis. Pt currently with functional limitations due to the deficits listed below (see PT Problem List). At the time of PT eval pt was able to perform transfers and ambulation with up to occasional min guard assist for modest LOB in hall. Pt never required assistance to recover, however hands-on guarding provided for safety. Pt will benefit from skilled PT to increase their independence and safety with mobility to allow discharge to the venue listed below.       Follow Up Recommendations No PT follow up;Supervision for mobility/OOB    Equipment Recommendations  None recommended by PT    Recommendations for Other Services       Precautions / Restrictions Precautions Precautions: Fall;Back Precaution Booklet Issued: Yes (comment) Precaution Comments: Reviewed in detail, and pt was cued for precautions during functional mobility.  Required Braces or Orthoses: Spinal Brace Spinal Brace: Lumbar corset;Applied in sitting position Restrictions Weight Bearing Restrictions: No      Mobility  Bed Mobility Overal bed mobility: Modified Independent Bed Mobility: Rolling;Sidelying to Sit           General bed mobility comments: VC's for proper log roll technique. No assist to complete.  Transfers Overall transfer level: Needs assistance Equipment used: None Transfers: Sit to/from Stand Sit to Stand: Supervision         General transfer comment: Pt was able to power-up to full stand without assistance. VC's for maintenance of precautions.   Ambulation/Gait Ambulation/Gait assistance: Supervision;Min guard Ambulation Distance (Feet): 400 Feet Assistive device: None Gait Pattern/deviations: Step-through pattern;Decreased  stride length;Staggering left;Staggering right;Trunk flexed Gait velocity: Decreased Gait velocity interpretation: Below normal speed for age/gender General Gait Details: VC's for improved posture and general safety. Pt grossly at a supervision level, however occasionally unsteady and requiring hands-on guarding for safety.   Stairs         General stair comments: Pt declined stair training as he only has 1 step into the house  Wheelchair Mobility    Modified Rankin (Stroke Patients Only)       Balance Overall balance assessment: Needs assistance Sitting-balance support: Feet supported;No upper extremity supported Sitting balance-Leahy Scale: Good     Standing balance support: No upper extremity supported;During functional activity Standing balance-Leahy Scale: Fair                               Pertinent Vitals/Pain Pain Assessment: Faces Faces Pain Scale: Hurts little more Pain Location: Incision site Pain Descriptors / Indicators: Operative site guarding;Discomfort Pain Intervention(s): Monitored during session;Limited activity within patient's tolerance;Repositioned    Home Living Family/patient expects to be discharged to:: Private residence Living Arrangements: Spouse/significant other Available Help at Discharge: Family;Available PRN/intermittently Type of Home: House Home Access: Stairs to enter Entrance Stairs-Rails: None Entrance Stairs-Number of Steps: 1 Home Layout: One level Home Equipment: Shower seat - built in      Prior Function Level of Independence: Independent               Hand Dominance   Dominant Hand: Right    Extremity/Trunk Assessment   Upper Extremity Assessment Upper Extremity Assessment: Overall WFL for tasks assessed    Lower Extremity Assessment Lower Extremity Assessment: Generalized  weakness (Consistent with pre-op diagnosis)    Cervical / Trunk Assessment Cervical / Trunk Assessment: Other  exceptions Cervical / Trunk Exceptions: s/p surgery  Communication   Communication: No difficulties  Cognition Arousal/Alertness: Awake/alert Behavior During Therapy: WFL for tasks assessed/performed Overall Cognitive Status: Within Functional Limits for tasks assessed                                        General Comments      Exercises     Assessment/Plan    PT Assessment Patient needs continued PT services  PT Problem List Decreased strength;Decreased range of motion;Decreased activity tolerance;Decreased balance;Decreased mobility;Decreased knowledge of use of DME;Decreased safety awareness;Decreased knowledge of precautions;Pain       PT Treatment Interventions DME instruction;Gait training;Stair training;Functional mobility training;Therapeutic activities;Therapeutic exercise;Neuromuscular re-education;Patient/family education    PT Goals (Current goals can be found in the Care Plan section)  Acute Rehab PT Goals Patient Stated Goal: Home today PT Goal Formulation: With patient Time For Goal Achievement: 03/09/17 Potential to Achieve Goals: Good    Frequency Min 5X/week   Barriers to discharge        Co-evaluation               AM-PAC PT "6 Clicks" Daily Activity  Outcome Measure Difficulty turning over in bed (including adjusting bedclothes, sheets and blankets)?: None Difficulty moving from lying on back to sitting on the side of the bed? : None Difficulty sitting down on and standing up from a chair with arms (e.g., wheelchair, bedside commode, etc,.)?: None Help needed moving to and from a bed to chair (including a wheelchair)?: None Help needed walking in hospital room?: A Little Help needed climbing 3-5 steps with a railing? : A Little 6 Click Score: 22    End of Session Equipment Utilized During Treatment: Back brace Activity Tolerance: Patient tolerated treatment well Patient left: with call bell/phone within reach (Sitting  EOB) Nurse Communication: Mobility status PT Visit Diagnosis: Unsteadiness on feet (R26.81);Pain;Other symptoms and signs involving the nervous system (R29.898) Pain - part of body:  (back)    Time: 9509-3267 PT Time Calculation (min) (ACUTE ONLY): 22 min   Charges:   PT Evaluation $PT Eval Moderate Complexity: 1 Mod     PT G Codes:        Rolinda Roan, PT, DPT Acute Rehabilitation Services Pager: [phone]   Thelma Comp 03/02/2017, 1:13 PM

## 2017-03-02 NOTE — Discharge Instructions (Signed)
Wound Care Keep incision covered and dry for 3 days    You may remove outer bandage after 3 days. Do not put any creams, lotions, or ointments on incision. Leave steri-strips on back.  They will fall off by themselves. Activity Walk each and every day, increasing distance each day. No lifting greater than 5 lbs.  Avoid excessive neck motion. No driving for 2 weeks; may ride as a passenger locally.  Diet Resume your normal soft diet.  Return to Work Will be discussed at you follow up appointment. Call Your Doctor If Any of These Occur Redness, drainage, or swelling at the wound.  Temperature greater than 101 degrees. Severe pain not relieved by pain medication. Increased difficulty swallowing.  Incision starts to come apart. Follow Up Appt Call today for appointment in 3 weeks ([phone]) or for problems.  If you have any hardware placed in your spine, you will need an x-ray before your appointment.

## 2017-03-03 MED FILL — Heparin Sodium (Porcine) Inj 1000 Unit/ML: INTRAMUSCULAR | Qty: 30 | Status: AC

## 2017-03-03 MED FILL — Sodium Chloride IV Soln 0.9%: INTRAVENOUS | Qty: 1000 | Status: AC

## 2017-03-10 DIAGNOSIS — Z5181 Encounter for therapeutic drug level monitoring: Secondary | ICD-10-CM | POA: Diagnosis not present

## 2017-03-10 DIAGNOSIS — I10 Essential (primary) hypertension: Secondary | ICD-10-CM | POA: Diagnosis not present

## 2017-03-10 DIAGNOSIS — E782 Mixed hyperlipidemia: Secondary | ICD-10-CM | POA: Diagnosis not present

## 2017-03-10 DIAGNOSIS — F1190 Opioid use, unspecified, uncomplicated: Secondary | ICD-10-CM | POA: Diagnosis not present

## 2017-03-10 DIAGNOSIS — E1165 Type 2 diabetes mellitus with hyperglycemia: Secondary | ICD-10-CM | POA: Diagnosis not present

## 2017-03-10 DIAGNOSIS — E790 Hyperuricemia without signs of inflammatory arthritis and tophaceous disease: Secondary | ICD-10-CM | POA: Diagnosis not present

## 2017-03-10 DIAGNOSIS — Z720 Tobacco use: Secondary | ICD-10-CM | POA: Diagnosis not present

## 2017-03-10 DIAGNOSIS — E559 Vitamin D deficiency, unspecified: Secondary | ICD-10-CM | POA: Diagnosis not present

## 2017-03-10 DIAGNOSIS — G8929 Other chronic pain: Secondary | ICD-10-CM | POA: Diagnosis not present

## 2017-03-10 DIAGNOSIS — E291 Testicular hypofunction: Secondary | ICD-10-CM | POA: Diagnosis not present

## 2017-03-10 DIAGNOSIS — R05 Cough: Secondary | ICD-10-CM | POA: Diagnosis not present

## 2017-03-10 DIAGNOSIS — R7989 Other specified abnormal findings of blood chemistry: Secondary | ICD-10-CM | POA: Diagnosis not present

## 2017-03-21 DIAGNOSIS — M4316 Spondylolisthesis, lumbar region: Secondary | ICD-10-CM | POA: Diagnosis not present

## 2017-03-29 DIAGNOSIS — L578 Other skin changes due to chronic exposure to nonionizing radiation: Secondary | ICD-10-CM | POA: Diagnosis not present

## 2017-03-29 DIAGNOSIS — D0462 Carcinoma in situ of skin of left upper limb, including shoulder: Secondary | ICD-10-CM | POA: Diagnosis not present

## 2017-03-29 DIAGNOSIS — L821 Other seborrheic keratosis: Secondary | ICD-10-CM | POA: Diagnosis not present

## 2017-03-29 DIAGNOSIS — L570 Actinic keratosis: Secondary | ICD-10-CM | POA: Diagnosis not present

## 2017-03-29 DIAGNOSIS — C44519 Basal cell carcinoma of skin of other part of trunk: Secondary | ICD-10-CM | POA: Diagnosis not present

## 2017-04-17 DIAGNOSIS — Z5181 Encounter for therapeutic drug level monitoring: Secondary | ICD-10-CM | POA: Diagnosis not present

## 2017-04-17 DIAGNOSIS — F17210 Nicotine dependence, cigarettes, uncomplicated: Secondary | ICD-10-CM | POA: Diagnosis not present

## 2017-04-17 DIAGNOSIS — I10 Essential (primary) hypertension: Secondary | ICD-10-CM | POA: Diagnosis not present

## 2017-04-17 DIAGNOSIS — E291 Testicular hypofunction: Secondary | ICD-10-CM | POA: Diagnosis not present

## 2017-04-17 DIAGNOSIS — F1190 Opioid use, unspecified, uncomplicated: Secondary | ICD-10-CM | POA: Diagnosis not present

## 2017-04-17 DIAGNOSIS — G8929 Other chronic pain: Secondary | ICD-10-CM | POA: Diagnosis not present

## 2017-05-12 DIAGNOSIS — I10 Essential (primary) hypertension: Secondary | ICD-10-CM | POA: Diagnosis not present

## 2017-05-12 DIAGNOSIS — F1190 Opioid use, unspecified, uncomplicated: Secondary | ICD-10-CM | POA: Diagnosis not present

## 2017-05-12 DIAGNOSIS — E291 Testicular hypofunction: Secondary | ICD-10-CM | POA: Diagnosis not present

## 2017-05-12 DIAGNOSIS — Z79891 Long term (current) use of opiate analgesic: Secondary | ICD-10-CM | POA: Diagnosis not present

## 2017-05-12 DIAGNOSIS — Z5181 Encounter for therapeutic drug level monitoring: Secondary | ICD-10-CM | POA: Diagnosis not present

## 2017-05-12 DIAGNOSIS — F17210 Nicotine dependence, cigarettes, uncomplicated: Secondary | ICD-10-CM | POA: Diagnosis not present

## 2017-05-12 DIAGNOSIS — J019 Acute sinusitis, unspecified: Secondary | ICD-10-CM | POA: Diagnosis not present

## 2017-05-12 DIAGNOSIS — R05 Cough: Secondary | ICD-10-CM | POA: Diagnosis not present

## 2017-05-12 DIAGNOSIS — F11220 Opioid dependence with intoxication, uncomplicated: Secondary | ICD-10-CM | POA: Diagnosis not present

## 2017-05-12 DIAGNOSIS — G8929 Other chronic pain: Secondary | ICD-10-CM | POA: Diagnosis not present

## 2017-06-16 DIAGNOSIS — I10 Essential (primary) hypertension: Secondary | ICD-10-CM | POA: Diagnosis not present

## 2017-06-16 DIAGNOSIS — E039 Hypothyroidism, unspecified: Secondary | ICD-10-CM | POA: Diagnosis not present

## 2017-06-16 DIAGNOSIS — E559 Vitamin D deficiency, unspecified: Secondary | ICD-10-CM | POA: Diagnosis not present

## 2017-06-16 DIAGNOSIS — R7989 Other specified abnormal findings of blood chemistry: Secondary | ICD-10-CM | POA: Diagnosis not present

## 2017-06-16 DIAGNOSIS — Z5181 Encounter for therapeutic drug level monitoring: Secondary | ICD-10-CM | POA: Diagnosis not present

## 2017-06-16 DIAGNOSIS — E291 Testicular hypofunction: Secondary | ICD-10-CM | POA: Diagnosis not present

## 2017-06-16 DIAGNOSIS — F1190 Opioid use, unspecified, uncomplicated: Secondary | ICD-10-CM | POA: Diagnosis not present

## 2017-06-16 DIAGNOSIS — E1165 Type 2 diabetes mellitus with hyperglycemia: Secondary | ICD-10-CM | POA: Diagnosis not present

## 2017-06-16 DIAGNOSIS — E790 Hyperuricemia without signs of inflammatory arthritis and tophaceous disease: Secondary | ICD-10-CM | POA: Diagnosis not present

## 2017-06-16 DIAGNOSIS — G8929 Other chronic pain: Secondary | ICD-10-CM | POA: Diagnosis not present

## 2017-06-16 DIAGNOSIS — E782 Mixed hyperlipidemia: Secondary | ICD-10-CM | POA: Diagnosis not present

## 2017-06-16 DIAGNOSIS — F17210 Nicotine dependence, cigarettes, uncomplicated: Secondary | ICD-10-CM | POA: Diagnosis not present

## 2017-06-28 DIAGNOSIS — L570 Actinic keratosis: Secondary | ICD-10-CM | POA: Diagnosis not present

## 2017-06-28 DIAGNOSIS — L728 Other follicular cysts of the skin and subcutaneous tissue: Secondary | ICD-10-CM | POA: Diagnosis not present

## 2017-06-28 DIAGNOSIS — C44519 Basal cell carcinoma of skin of other part of trunk: Secondary | ICD-10-CM | POA: Diagnosis not present

## 2017-07-14 DIAGNOSIS — E291 Testicular hypofunction: Secondary | ICD-10-CM | POA: Diagnosis not present

## 2017-07-14 DIAGNOSIS — F1190 Opioid use, unspecified, uncomplicated: Secondary | ICD-10-CM | POA: Diagnosis not present

## 2017-07-14 DIAGNOSIS — F17210 Nicotine dependence, cigarettes, uncomplicated: Secondary | ICD-10-CM | POA: Diagnosis not present

## 2017-07-14 DIAGNOSIS — Z5181 Encounter for therapeutic drug level monitoring: Secondary | ICD-10-CM | POA: Diagnosis not present

## 2017-07-14 DIAGNOSIS — D72820 Lymphocytosis (symptomatic): Secondary | ICD-10-CM | POA: Diagnosis not present

## 2017-07-14 DIAGNOSIS — G8929 Other chronic pain: Secondary | ICD-10-CM | POA: Diagnosis not present

## 2017-07-14 DIAGNOSIS — I10 Essential (primary) hypertension: Secondary | ICD-10-CM | POA: Diagnosis not present

## 2017-07-28 DIAGNOSIS — D72829 Elevated white blood cell count, unspecified: Secondary | ICD-10-CM | POA: Diagnosis not present

## 2017-07-28 DIAGNOSIS — Z716 Tobacco abuse counseling: Secondary | ICD-10-CM | POA: Diagnosis not present

## 2017-07-28 DIAGNOSIS — Z862 Personal history of diseases of the blood and blood-forming organs and certain disorders involving the immune mechanism: Secondary | ICD-10-CM | POA: Diagnosis not present

## 2017-08-11 DIAGNOSIS — D72820 Lymphocytosis (symptomatic): Secondary | ICD-10-CM | POA: Diagnosis not present

## 2017-08-11 DIAGNOSIS — F11220 Opioid dependence with intoxication, uncomplicated: Secondary | ICD-10-CM | POA: Diagnosis not present

## 2017-08-11 DIAGNOSIS — F1190 Opioid use, unspecified, uncomplicated: Secondary | ICD-10-CM | POA: Diagnosis not present

## 2017-08-11 DIAGNOSIS — Z5181 Encounter for therapeutic drug level monitoring: Secondary | ICD-10-CM | POA: Diagnosis not present

## 2017-08-11 DIAGNOSIS — Z79891 Long term (current) use of opiate analgesic: Secondary | ICD-10-CM | POA: Diagnosis not present

## 2017-08-11 DIAGNOSIS — I10 Essential (primary) hypertension: Secondary | ICD-10-CM | POA: Diagnosis not present

## 2017-08-11 DIAGNOSIS — G8929 Other chronic pain: Secondary | ICD-10-CM | POA: Diagnosis not present

## 2017-08-11 DIAGNOSIS — F17210 Nicotine dependence, cigarettes, uncomplicated: Secondary | ICD-10-CM | POA: Diagnosis not present

## 2017-08-30 DIAGNOSIS — J0100 Acute maxillary sinusitis, unspecified: Secondary | ICD-10-CM | POA: Diagnosis not present

## 2017-08-30 DIAGNOSIS — H6123 Impacted cerumen, bilateral: Secondary | ICD-10-CM | POA: Diagnosis not present

## 2017-09-11 DIAGNOSIS — I10 Essential (primary) hypertension: Secondary | ICD-10-CM | POA: Diagnosis not present

## 2017-09-11 DIAGNOSIS — F411 Generalized anxiety disorder: Secondary | ICD-10-CM | POA: Diagnosis not present

## 2017-09-11 DIAGNOSIS — R7989 Other specified abnormal findings of blood chemistry: Secondary | ICD-10-CM | POA: Diagnosis not present

## 2017-09-11 DIAGNOSIS — F17210 Nicotine dependence, cigarettes, uncomplicated: Secondary | ICD-10-CM | POA: Diagnosis not present

## 2017-09-11 DIAGNOSIS — E559 Vitamin D deficiency, unspecified: Secondary | ICD-10-CM | POA: Diagnosis not present

## 2017-09-11 DIAGNOSIS — G8929 Other chronic pain: Secondary | ICD-10-CM | POA: Diagnosis not present

## 2017-09-11 DIAGNOSIS — F1190 Opioid use, unspecified, uncomplicated: Secondary | ICD-10-CM | POA: Diagnosis not present

## 2017-09-11 DIAGNOSIS — E291 Testicular hypofunction: Secondary | ICD-10-CM | POA: Diagnosis not present

## 2017-09-11 DIAGNOSIS — Z5181 Encounter for therapeutic drug level monitoring: Secondary | ICD-10-CM | POA: Diagnosis not present

## 2017-10-03 DIAGNOSIS — M4316 Spondylolisthesis, lumbar region: Secondary | ICD-10-CM | POA: Diagnosis not present

## 2017-10-03 DIAGNOSIS — I10 Essential (primary) hypertension: Secondary | ICD-10-CM | POA: Diagnosis not present

## 2017-10-13 DIAGNOSIS — G8929 Other chronic pain: Secondary | ICD-10-CM | POA: Diagnosis not present

## 2017-10-13 DIAGNOSIS — F1190 Opioid use, unspecified, uncomplicated: Secondary | ICD-10-CM | POA: Diagnosis not present

## 2017-10-13 DIAGNOSIS — E291 Testicular hypofunction: Secondary | ICD-10-CM | POA: Diagnosis not present

## 2017-10-13 DIAGNOSIS — F17210 Nicotine dependence, cigarettes, uncomplicated: Secondary | ICD-10-CM | POA: Diagnosis not present

## 2017-10-13 DIAGNOSIS — I10 Essential (primary) hypertension: Secondary | ICD-10-CM | POA: Diagnosis not present

## 2017-10-13 DIAGNOSIS — Z5181 Encounter for therapeutic drug level monitoring: Secondary | ICD-10-CM | POA: Diagnosis not present

## 2017-10-13 DIAGNOSIS — M545 Low back pain: Secondary | ICD-10-CM | POA: Diagnosis not present

## 2017-10-30 DIAGNOSIS — L728 Other follicular cysts of the skin and subcutaneous tissue: Secondary | ICD-10-CM | POA: Diagnosis not present

## 2017-10-30 DIAGNOSIS — L570 Actinic keratosis: Secondary | ICD-10-CM | POA: Diagnosis not present

## 2017-10-30 DIAGNOSIS — L578 Other skin changes due to chronic exposure to nonionizing radiation: Secondary | ICD-10-CM | POA: Diagnosis not present

## 2017-11-13 DIAGNOSIS — G8929 Other chronic pain: Secondary | ICD-10-CM | POA: Diagnosis not present

## 2017-11-13 DIAGNOSIS — I10 Essential (primary) hypertension: Secondary | ICD-10-CM | POA: Diagnosis not present

## 2017-11-13 DIAGNOSIS — Z5181 Encounter for therapeutic drug level monitoring: Secondary | ICD-10-CM | POA: Diagnosis not present

## 2017-11-13 DIAGNOSIS — F411 Generalized anxiety disorder: Secondary | ICD-10-CM | POA: Diagnosis not present

## 2017-11-13 DIAGNOSIS — F1190 Opioid use, unspecified, uncomplicated: Secondary | ICD-10-CM | POA: Diagnosis not present

## 2017-11-13 DIAGNOSIS — F17210 Nicotine dependence, cigarettes, uncomplicated: Secondary | ICD-10-CM | POA: Diagnosis not present

## 2017-12-12 DIAGNOSIS — R7989 Other specified abnormal findings of blood chemistry: Secondary | ICD-10-CM | POA: Diagnosis not present

## 2017-12-12 DIAGNOSIS — M545 Low back pain: Secondary | ICD-10-CM | POA: Diagnosis not present

## 2017-12-12 DIAGNOSIS — E782 Mixed hyperlipidemia: Secondary | ICD-10-CM | POA: Diagnosis not present

## 2017-12-12 DIAGNOSIS — E291 Testicular hypofunction: Secondary | ICD-10-CM | POA: Diagnosis not present

## 2017-12-12 DIAGNOSIS — Z5181 Encounter for therapeutic drug level monitoring: Secondary | ICD-10-CM | POA: Diagnosis not present

## 2017-12-12 DIAGNOSIS — E559 Vitamin D deficiency, unspecified: Secondary | ICD-10-CM | POA: Diagnosis not present

## 2017-12-12 DIAGNOSIS — G8929 Other chronic pain: Secondary | ICD-10-CM | POA: Diagnosis not present

## 2017-12-12 DIAGNOSIS — I10 Essential (primary) hypertension: Secondary | ICD-10-CM | POA: Diagnosis not present

## 2017-12-12 DIAGNOSIS — F1190 Opioid use, unspecified, uncomplicated: Secondary | ICD-10-CM | POA: Diagnosis not present

## 2018-01-11 DIAGNOSIS — G8929 Other chronic pain: Secondary | ICD-10-CM | POA: Diagnosis not present

## 2018-01-11 DIAGNOSIS — F17210 Nicotine dependence, cigarettes, uncomplicated: Secondary | ICD-10-CM | POA: Diagnosis not present

## 2018-01-11 DIAGNOSIS — I10 Essential (primary) hypertension: Secondary | ICD-10-CM | POA: Diagnosis not present

## 2018-01-11 DIAGNOSIS — E291 Testicular hypofunction: Secondary | ICD-10-CM | POA: Diagnosis not present

## 2018-01-11 DIAGNOSIS — F1190 Opioid use, unspecified, uncomplicated: Secondary | ICD-10-CM | POA: Diagnosis not present

## 2018-01-11 DIAGNOSIS — Z5181 Encounter for therapeutic drug level monitoring: Secondary | ICD-10-CM | POA: Diagnosis not present

## 2018-02-09 DIAGNOSIS — I10 Essential (primary) hypertension: Secondary | ICD-10-CM | POA: Diagnosis not present

## 2018-02-09 DIAGNOSIS — F1190 Opioid use, unspecified, uncomplicated: Secondary | ICD-10-CM | POA: Diagnosis not present

## 2018-02-09 DIAGNOSIS — Z5181 Encounter for therapeutic drug level monitoring: Secondary | ICD-10-CM | POA: Diagnosis not present

## 2018-02-09 DIAGNOSIS — G8929 Other chronic pain: Secondary | ICD-10-CM | POA: Diagnosis not present

## 2018-02-09 DIAGNOSIS — I519 Heart disease, unspecified: Secondary | ICD-10-CM | POA: Diagnosis not present

## 2018-02-09 DIAGNOSIS — F17210 Nicotine dependence, cigarettes, uncomplicated: Secondary | ICD-10-CM | POA: Diagnosis not present

## 2018-02-09 DIAGNOSIS — E291 Testicular hypofunction: Secondary | ICD-10-CM | POA: Diagnosis not present

## 2018-03-12 DIAGNOSIS — R52 Pain, unspecified: Secondary | ICD-10-CM | POA: Diagnosis not present

## 2018-03-12 DIAGNOSIS — Z23 Encounter for immunization: Secondary | ICD-10-CM | POA: Diagnosis not present

## 2018-03-12 DIAGNOSIS — Z5181 Encounter for therapeutic drug level monitoring: Secondary | ICD-10-CM | POA: Diagnosis not present

## 2018-03-12 DIAGNOSIS — F17210 Nicotine dependence, cigarettes, uncomplicated: Secondary | ICD-10-CM | POA: Diagnosis not present

## 2018-03-12 DIAGNOSIS — I10 Essential (primary) hypertension: Secondary | ICD-10-CM | POA: Diagnosis not present

## 2018-03-12 DIAGNOSIS — E291 Testicular hypofunction: Secondary | ICD-10-CM | POA: Diagnosis not present

## 2018-03-12 DIAGNOSIS — F1190 Opioid use, unspecified, uncomplicated: Secondary | ICD-10-CM | POA: Diagnosis not present

## 2018-03-12 DIAGNOSIS — I519 Heart disease, unspecified: Secondary | ICD-10-CM | POA: Diagnosis not present

## 2018-03-12 DIAGNOSIS — R947 Abnormal results of other endocrine function studies: Secondary | ICD-10-CM | POA: Diagnosis not present

## 2018-03-12 DIAGNOSIS — G8929 Other chronic pain: Secondary | ICD-10-CM | POA: Diagnosis not present

## 2018-03-12 DIAGNOSIS — E559 Vitamin D deficiency, unspecified: Secondary | ICD-10-CM | POA: Diagnosis not present

## 2018-03-12 DIAGNOSIS — E663 Overweight: Secondary | ICD-10-CM | POA: Diagnosis not present

## 2018-04-11 DIAGNOSIS — F17210 Nicotine dependence, cigarettes, uncomplicated: Secondary | ICD-10-CM | POA: Diagnosis not present

## 2018-04-11 DIAGNOSIS — I10 Essential (primary) hypertension: Secondary | ICD-10-CM | POA: Diagnosis not present

## 2018-04-11 DIAGNOSIS — E291 Testicular hypofunction: Secondary | ICD-10-CM | POA: Diagnosis not present

## 2018-04-11 DIAGNOSIS — G8929 Other chronic pain: Secondary | ICD-10-CM | POA: Diagnosis not present

## 2018-04-11 DIAGNOSIS — Z5181 Encounter for therapeutic drug level monitoring: Secondary | ICD-10-CM | POA: Diagnosis not present

## 2018-04-11 DIAGNOSIS — F1190 Opioid use, unspecified, uncomplicated: Secondary | ICD-10-CM | POA: Diagnosis not present

## 2018-05-03 DIAGNOSIS — L570 Actinic keratosis: Secondary | ICD-10-CM | POA: Diagnosis not present

## 2018-05-03 DIAGNOSIS — L578 Other skin changes due to chronic exposure to nonionizing radiation: Secondary | ICD-10-CM | POA: Diagnosis not present

## 2018-05-03 DIAGNOSIS — L821 Other seborrheic keratosis: Secondary | ICD-10-CM | POA: Diagnosis not present

## 2018-05-03 DIAGNOSIS — D225 Melanocytic nevi of trunk: Secondary | ICD-10-CM | POA: Diagnosis not present

## 2018-05-10 DIAGNOSIS — F17210 Nicotine dependence, cigarettes, uncomplicated: Secondary | ICD-10-CM | POA: Diagnosis not present

## 2018-05-10 DIAGNOSIS — I10 Essential (primary) hypertension: Secondary | ICD-10-CM | POA: Diagnosis not present

## 2018-05-10 DIAGNOSIS — Z5181 Encounter for therapeutic drug level monitoring: Secondary | ICD-10-CM | POA: Diagnosis not present

## 2018-05-10 DIAGNOSIS — E291 Testicular hypofunction: Secondary | ICD-10-CM | POA: Diagnosis not present

## 2018-05-10 DIAGNOSIS — F1190 Opioid use, unspecified, uncomplicated: Secondary | ICD-10-CM | POA: Diagnosis not present

## 2018-05-10 DIAGNOSIS — G8929 Other chronic pain: Secondary | ICD-10-CM | POA: Diagnosis not present

## 2018-06-06 DIAGNOSIS — E663 Overweight: Secondary | ICD-10-CM | POA: Diagnosis not present

## 2018-06-06 DIAGNOSIS — E291 Testicular hypofunction: Secondary | ICD-10-CM | POA: Diagnosis not present

## 2018-06-06 DIAGNOSIS — R3912 Poor urinary stream: Secondary | ICD-10-CM | POA: Diagnosis not present

## 2018-06-06 DIAGNOSIS — M545 Low back pain: Secondary | ICD-10-CM | POA: Diagnosis not present

## 2018-06-06 DIAGNOSIS — I10 Essential (primary) hypertension: Secondary | ICD-10-CM | POA: Diagnosis not present

## 2018-06-06 DIAGNOSIS — R52 Pain, unspecified: Secondary | ICD-10-CM | POA: Diagnosis not present

## 2018-06-06 DIAGNOSIS — R947 Abnormal results of other endocrine function studies: Secondary | ICD-10-CM | POA: Diagnosis not present

## 2018-06-06 DIAGNOSIS — E559 Vitamin D deficiency, unspecified: Secondary | ICD-10-CM | POA: Diagnosis not present

## 2018-06-06 DIAGNOSIS — G8929 Other chronic pain: Secondary | ICD-10-CM | POA: Diagnosis not present

## 2018-06-06 DIAGNOSIS — R7309 Other abnormal glucose: Secondary | ICD-10-CM | POA: Diagnosis not present

## 2018-06-06 DIAGNOSIS — F1190 Opioid use, unspecified, uncomplicated: Secondary | ICD-10-CM | POA: Diagnosis not present

## 2018-06-06 DIAGNOSIS — Z5181 Encounter for therapeutic drug level monitoring: Secondary | ICD-10-CM | POA: Diagnosis not present

## 2018-07-06 DIAGNOSIS — G8929 Other chronic pain: Secondary | ICD-10-CM | POA: Diagnosis not present

## 2018-07-06 DIAGNOSIS — M5441 Lumbago with sciatica, right side: Secondary | ICD-10-CM | POA: Diagnosis not present

## 2018-07-06 DIAGNOSIS — M5137 Other intervertebral disc degeneration, lumbosacral region: Secondary | ICD-10-CM | POA: Diagnosis not present

## 2018-07-06 DIAGNOSIS — I10 Essential (primary) hypertension: Secondary | ICD-10-CM | POA: Diagnosis not present

## 2018-07-06 DIAGNOSIS — E291 Testicular hypofunction: Secondary | ICD-10-CM | POA: Diagnosis not present

## 2018-07-06 DIAGNOSIS — F1190 Opioid use, unspecified, uncomplicated: Secondary | ICD-10-CM | POA: Diagnosis not present

## 2018-07-06 DIAGNOSIS — Z5181 Encounter for therapeutic drug level monitoring: Secondary | ICD-10-CM | POA: Diagnosis not present

## 2018-08-07 DIAGNOSIS — M5441 Lumbago with sciatica, right side: Secondary | ICD-10-CM | POA: Diagnosis not present

## 2018-08-07 DIAGNOSIS — G8929 Other chronic pain: Secondary | ICD-10-CM | POA: Diagnosis not present

## 2018-08-07 DIAGNOSIS — F411 Generalized anxiety disorder: Secondary | ICD-10-CM | POA: Diagnosis not present

## 2018-08-07 DIAGNOSIS — Z79891 Long term (current) use of opiate analgesic: Secondary | ICD-10-CM | POA: Diagnosis not present

## 2018-08-07 DIAGNOSIS — F17210 Nicotine dependence, cigarettes, uncomplicated: Secondary | ICD-10-CM | POA: Diagnosis not present

## 2018-08-07 DIAGNOSIS — Z79899 Other long term (current) drug therapy: Secondary | ICD-10-CM | POA: Diagnosis not present

## 2018-08-07 DIAGNOSIS — M5137 Other intervertebral disc degeneration, lumbosacral region: Secondary | ICD-10-CM | POA: Diagnosis not present

## 2018-08-07 DIAGNOSIS — F1190 Opioid use, unspecified, uncomplicated: Secondary | ICD-10-CM | POA: Diagnosis not present

## 2018-08-07 DIAGNOSIS — Z5181 Encounter for therapeutic drug level monitoring: Secondary | ICD-10-CM | POA: Diagnosis not present

## 2018-08-07 DIAGNOSIS — I10 Essential (primary) hypertension: Secondary | ICD-10-CM | POA: Diagnosis not present

## 2018-08-07 DIAGNOSIS — E291 Testicular hypofunction: Secondary | ICD-10-CM | POA: Diagnosis not present

## 2018-09-04 DIAGNOSIS — F411 Generalized anxiety disorder: Secondary | ICD-10-CM | POA: Diagnosis not present

## 2018-09-04 DIAGNOSIS — M5137 Other intervertebral disc degeneration, lumbosacral region: Secondary | ICD-10-CM | POA: Diagnosis not present

## 2018-09-04 DIAGNOSIS — E291 Testicular hypofunction: Secondary | ICD-10-CM | POA: Diagnosis not present

## 2018-09-04 DIAGNOSIS — M5441 Lumbago with sciatica, right side: Secondary | ICD-10-CM | POA: Diagnosis not present

## 2018-09-04 DIAGNOSIS — I10 Essential (primary) hypertension: Secondary | ICD-10-CM | POA: Diagnosis not present

## 2018-10-04 DIAGNOSIS — F411 Generalized anxiety disorder: Secondary | ICD-10-CM | POA: Diagnosis not present

## 2018-10-04 DIAGNOSIS — M5137 Other intervertebral disc degeneration, lumbosacral region: Secondary | ICD-10-CM | POA: Diagnosis not present

## 2018-10-04 DIAGNOSIS — Z79891 Long term (current) use of opiate analgesic: Secondary | ICD-10-CM | POA: Diagnosis not present

## 2018-10-04 DIAGNOSIS — I10 Essential (primary) hypertension: Secondary | ICD-10-CM | POA: Diagnosis not present

## 2018-10-04 DIAGNOSIS — M5441 Lumbago with sciatica, right side: Secondary | ICD-10-CM | POA: Diagnosis not present

## 2018-10-04 DIAGNOSIS — R509 Fever, unspecified: Secondary | ICD-10-CM | POA: Diagnosis not present

## 2018-11-01 DIAGNOSIS — L570 Actinic keratosis: Secondary | ICD-10-CM | POA: Diagnosis not present

## 2018-11-01 DIAGNOSIS — L578 Other skin changes due to chronic exposure to nonionizing radiation: Secondary | ICD-10-CM | POA: Diagnosis not present

## 2018-11-01 DIAGNOSIS — L821 Other seborrheic keratosis: Secondary | ICD-10-CM | POA: Diagnosis not present

## 2018-11-01 DIAGNOSIS — C44612 Basal cell carcinoma of skin of right upper limb, including shoulder: Secondary | ICD-10-CM | POA: Diagnosis not present

## 2018-11-02 DIAGNOSIS — J309 Allergic rhinitis, unspecified: Secondary | ICD-10-CM | POA: Diagnosis not present

## 2018-11-02 DIAGNOSIS — F17210 Nicotine dependence, cigarettes, uncomplicated: Secondary | ICD-10-CM | POA: Diagnosis not present

## 2018-11-02 DIAGNOSIS — Z5181 Encounter for therapeutic drug level monitoring: Secondary | ICD-10-CM | POA: Diagnosis not present

## 2018-11-02 DIAGNOSIS — E291 Testicular hypofunction: Secondary | ICD-10-CM | POA: Diagnosis not present

## 2018-11-02 DIAGNOSIS — G4700 Insomnia, unspecified: Secondary | ICD-10-CM | POA: Diagnosis not present

## 2018-11-02 DIAGNOSIS — I10 Essential (primary) hypertension: Secondary | ICD-10-CM | POA: Diagnosis not present

## 2018-11-02 DIAGNOSIS — M5137 Other intervertebral disc degeneration, lumbosacral region: Secondary | ICD-10-CM | POA: Diagnosis not present

## 2018-11-02 DIAGNOSIS — Z79891 Long term (current) use of opiate analgesic: Secondary | ICD-10-CM | POA: Diagnosis not present

## 2018-11-02 DIAGNOSIS — R5383 Other fatigue: Secondary | ICD-10-CM | POA: Diagnosis not present

## 2018-11-02 DIAGNOSIS — H9313 Tinnitus, bilateral: Secondary | ICD-10-CM | POA: Diagnosis not present

## 2018-11-02 DIAGNOSIS — F1190 Opioid use, unspecified, uncomplicated: Secondary | ICD-10-CM | POA: Diagnosis not present

## 2018-11-02 DIAGNOSIS — R947 Abnormal results of other endocrine function studies: Secondary | ICD-10-CM | POA: Diagnosis not present

## 2018-11-02 DIAGNOSIS — G8929 Other chronic pain: Secondary | ICD-10-CM | POA: Diagnosis not present

## 2018-11-02 DIAGNOSIS — E559 Vitamin D deficiency, unspecified: Secondary | ICD-10-CM | POA: Diagnosis not present

## 2018-11-02 DIAGNOSIS — M5441 Lumbago with sciatica, right side: Secondary | ICD-10-CM | POA: Diagnosis not present

## 2018-11-15 DIAGNOSIS — C44612 Basal cell carcinoma of skin of right upper limb, including shoulder: Secondary | ICD-10-CM | POA: Diagnosis not present

## 2018-11-19 DIAGNOSIS — H9190 Unspecified hearing loss, unspecified ear: Secondary | ICD-10-CM | POA: Diagnosis not present

## 2018-11-19 DIAGNOSIS — Z77122 Contact with and (suspected) exposure to noise: Secondary | ICD-10-CM | POA: Diagnosis not present

## 2018-11-19 DIAGNOSIS — H6121 Impacted cerumen, right ear: Secondary | ICD-10-CM | POA: Diagnosis not present

## 2018-11-19 DIAGNOSIS — H9319 Tinnitus, unspecified ear: Secondary | ICD-10-CM | POA: Diagnosis not present

## 2018-11-29 DIAGNOSIS — H903 Sensorineural hearing loss, bilateral: Secondary | ICD-10-CM | POA: Diagnosis not present

## 2018-11-30 DIAGNOSIS — F1190 Opioid use, unspecified, uncomplicated: Secondary | ICD-10-CM | POA: Diagnosis not present

## 2018-11-30 DIAGNOSIS — F411 Generalized anxiety disorder: Secondary | ICD-10-CM | POA: Diagnosis not present

## 2018-11-30 DIAGNOSIS — F17210 Nicotine dependence, cigarettes, uncomplicated: Secondary | ICD-10-CM | POA: Diagnosis not present

## 2018-11-30 DIAGNOSIS — G8929 Other chronic pain: Secondary | ICD-10-CM | POA: Diagnosis not present

## 2018-11-30 DIAGNOSIS — Z5181 Encounter for therapeutic drug level monitoring: Secondary | ICD-10-CM | POA: Diagnosis not present

## 2018-11-30 DIAGNOSIS — Z79891 Long term (current) use of opiate analgesic: Secondary | ICD-10-CM | POA: Diagnosis not present

## 2018-11-30 DIAGNOSIS — I10 Essential (primary) hypertension: Secondary | ICD-10-CM | POA: Diagnosis not present

## 2018-11-30 DIAGNOSIS — M5137 Other intervertebral disc degeneration, lumbosacral region: Secondary | ICD-10-CM | POA: Diagnosis not present

## 2018-11-30 DIAGNOSIS — M5441 Lumbago with sciatica, right side: Secondary | ICD-10-CM | POA: Diagnosis not present

## 2018-11-30 DIAGNOSIS — H918X9 Other specified hearing loss, unspecified ear: Secondary | ICD-10-CM | POA: Diagnosis not present

## 2018-11-30 DIAGNOSIS — E291 Testicular hypofunction: Secondary | ICD-10-CM | POA: Diagnosis not present

## 2018-11-30 DIAGNOSIS — G4700 Insomnia, unspecified: Secondary | ICD-10-CM | POA: Diagnosis not present

## 2018-12-28 DIAGNOSIS — F411 Generalized anxiety disorder: Secondary | ICD-10-CM | POA: Diagnosis not present

## 2018-12-28 DIAGNOSIS — F1190 Opioid use, unspecified, uncomplicated: Secondary | ICD-10-CM | POA: Diagnosis not present

## 2018-12-28 DIAGNOSIS — Z5181 Encounter for therapeutic drug level monitoring: Secondary | ICD-10-CM | POA: Diagnosis not present

## 2018-12-28 DIAGNOSIS — I10 Essential (primary) hypertension: Secondary | ICD-10-CM | POA: Diagnosis not present

## 2018-12-28 DIAGNOSIS — Z79891 Long term (current) use of opiate analgesic: Secondary | ICD-10-CM | POA: Diagnosis not present

## 2018-12-28 DIAGNOSIS — G8929 Other chronic pain: Secondary | ICD-10-CM | POA: Diagnosis not present

## 2018-12-28 DIAGNOSIS — M5441 Lumbago with sciatica, right side: Secondary | ICD-10-CM | POA: Diagnosis not present

## 2018-12-28 DIAGNOSIS — M5137 Other intervertebral disc degeneration, lumbosacral region: Secondary | ICD-10-CM | POA: Diagnosis not present

## 2018-12-28 DIAGNOSIS — J302 Other seasonal allergic rhinitis: Secondary | ICD-10-CM | POA: Diagnosis not present

## 2019-01-04 DIAGNOSIS — M4316 Spondylolisthesis, lumbar region: Secondary | ICD-10-CM | POA: Diagnosis not present

## 2019-01-04 DIAGNOSIS — M5416 Radiculopathy, lumbar region: Secondary | ICD-10-CM | POA: Diagnosis not present

## 2019-01-09 DIAGNOSIS — M5117 Intervertebral disc disorders with radiculopathy, lumbosacral region: Secondary | ICD-10-CM | POA: Diagnosis not present

## 2019-01-09 DIAGNOSIS — M5116 Intervertebral disc disorders with radiculopathy, lumbar region: Secondary | ICD-10-CM | POA: Diagnosis not present

## 2019-01-09 DIAGNOSIS — M5416 Radiculopathy, lumbar region: Secondary | ICD-10-CM | POA: Diagnosis not present

## 2019-01-31 DIAGNOSIS — E291 Testicular hypofunction: Secondary | ICD-10-CM | POA: Diagnosis not present

## 2019-01-31 DIAGNOSIS — F322 Major depressive disorder, single episode, severe without psychotic features: Secondary | ICD-10-CM | POA: Diagnosis not present

## 2019-01-31 DIAGNOSIS — M5441 Lumbago with sciatica, right side: Secondary | ICD-10-CM | POA: Diagnosis not present

## 2019-01-31 DIAGNOSIS — Z79891 Long term (current) use of opiate analgesic: Secondary | ICD-10-CM | POA: Diagnosis not present

## 2019-01-31 DIAGNOSIS — F1190 Opioid use, unspecified, uncomplicated: Secondary | ICD-10-CM | POA: Diagnosis not present

## 2019-01-31 DIAGNOSIS — F411 Generalized anxiety disorder: Secondary | ICD-10-CM | POA: Diagnosis not present

## 2019-01-31 DIAGNOSIS — Z5181 Encounter for therapeutic drug level monitoring: Secondary | ICD-10-CM | POA: Diagnosis not present

## 2019-01-31 DIAGNOSIS — G8929 Other chronic pain: Secondary | ICD-10-CM | POA: Diagnosis not present

## 2019-01-31 DIAGNOSIS — Z760 Encounter for issue of repeat prescription: Secondary | ICD-10-CM | POA: Diagnosis not present

## 2019-01-31 DIAGNOSIS — I10 Essential (primary) hypertension: Secondary | ICD-10-CM | POA: Diagnosis not present

## 2019-01-31 DIAGNOSIS — M5137 Other intervertebral disc degeneration, lumbosacral region: Secondary | ICD-10-CM | POA: Diagnosis not present

## 2019-02-15 DIAGNOSIS — F411 Generalized anxiety disorder: Secondary | ICD-10-CM | POA: Diagnosis not present

## 2019-02-15 DIAGNOSIS — I10 Essential (primary) hypertension: Secondary | ICD-10-CM | POA: Diagnosis not present

## 2019-02-15 DIAGNOSIS — Z760 Encounter for issue of repeat prescription: Secondary | ICD-10-CM | POA: Diagnosis not present

## 2019-02-15 DIAGNOSIS — F322 Major depressive disorder, single episode, severe without psychotic features: Secondary | ICD-10-CM | POA: Diagnosis not present

## 2019-03-01 DIAGNOSIS — Z760 Encounter for issue of repeat prescription: Secondary | ICD-10-CM | POA: Diagnosis not present

## 2019-03-01 DIAGNOSIS — G8929 Other chronic pain: Secondary | ICD-10-CM | POA: Diagnosis not present

## 2019-03-01 DIAGNOSIS — Z5181 Encounter for therapeutic drug level monitoring: Secondary | ICD-10-CM | POA: Diagnosis not present

## 2019-03-01 DIAGNOSIS — F1190 Opioid use, unspecified, uncomplicated: Secondary | ICD-10-CM | POA: Diagnosis not present

## 2019-03-01 DIAGNOSIS — F322 Major depressive disorder, single episode, severe without psychotic features: Secondary | ICD-10-CM | POA: Diagnosis not present

## 2019-03-01 DIAGNOSIS — F411 Generalized anxiety disorder: Secondary | ICD-10-CM | POA: Diagnosis not present

## 2019-03-01 DIAGNOSIS — M545 Low back pain: Secondary | ICD-10-CM | POA: Diagnosis not present

## 2019-03-01 DIAGNOSIS — I10 Essential (primary) hypertension: Secondary | ICD-10-CM | POA: Diagnosis not present

## 2019-03-01 DIAGNOSIS — Z79891 Long term (current) use of opiate analgesic: Secondary | ICD-10-CM | POA: Diagnosis not present

## 2019-03-15 ENCOUNTER — Telehealth: Payer: Self-pay | Admitting: Nurse Practitioner

## 2019-03-15 ENCOUNTER — Other Ambulatory Visit: Payer: Self-pay | Admitting: Neurosurgery

## 2019-03-15 DIAGNOSIS — G8929 Other chronic pain: Secondary | ICD-10-CM

## 2019-03-15 DIAGNOSIS — M545 Low back pain: Secondary | ICD-10-CM | POA: Diagnosis not present

## 2019-03-15 DIAGNOSIS — I10 Essential (primary) hypertension: Secondary | ICD-10-CM | POA: Diagnosis not present

## 2019-03-15 NOTE — Telephone Encounter (Signed)
Phone call to patient to verify medication list and allergies for myelogram procedure. Pt aware he will not need to hold any medications for this procedure. Pre and post procedure instructions reviewed with pt. 

## 2019-03-22 ENCOUNTER — Other Ambulatory Visit: Payer: PPO

## 2019-03-26 NOTE — Discharge Instructions (Signed)

## 2019-03-27 ENCOUNTER — Ambulatory Visit
Admission: RE | Admit: 2019-03-27 | Discharge: 2019-03-27 | Disposition: A | Discharge status: Home / Self Care | Payer: PPO | Source: Ambulatory Visit | Attending: Neurosurgery | Admitting: Neurosurgery

## 2019-03-27 ENCOUNTER — Other Ambulatory Visit: Payer: Self-pay

## 2019-03-27 DIAGNOSIS — M5126 Other intervertebral disc displacement, lumbar region: Secondary | ICD-10-CM | POA: Diagnosis not present

## 2019-03-27 DIAGNOSIS — M48061 Spinal stenosis, lumbar region without neurogenic claudication: Secondary | ICD-10-CM | POA: Diagnosis not present

## 2019-03-27 DIAGNOSIS — G8929 Other chronic pain: Secondary | ICD-10-CM

## 2019-03-27 DIAGNOSIS — M5124 Other intervertebral disc displacement, thoracic region: Secondary | ICD-10-CM | POA: Diagnosis not present

## 2019-03-27 DIAGNOSIS — M545 Low back pain, unspecified: Secondary | ICD-10-CM

## 2019-03-27 DIAGNOSIS — M5136 Other intervertebral disc degeneration, lumbar region: Secondary | ICD-10-CM | POA: Diagnosis not present

## 2019-03-27 DIAGNOSIS — M4316 Spondylolisthesis, lumbar region: Secondary | ICD-10-CM | POA: Diagnosis not present

## 2019-03-27 MED ORDER — IOPAMIDOL (ISOVUE-M 200) INJECTION 41%
18.0000 mL | Freq: Once | INTRAMUSCULAR | Status: AC
  Administered 2019-03-27: 11:00:00 18 mL via INTRATHECAL

## 2019-03-27 MED ORDER — DIAZEPAM 5 MG PO TABS
5.0000 mg | ORAL_TABLET | Freq: Once | ORAL | Status: AC
  Administered 2019-03-27: 10:00:00 5 mg via ORAL

## 2019-04-01 DIAGNOSIS — I10 Essential (primary) hypertension: Secondary | ICD-10-CM | POA: Diagnosis not present

## 2019-04-01 DIAGNOSIS — M545 Low back pain: Secondary | ICD-10-CM | POA: Diagnosis not present

## 2019-04-01 DIAGNOSIS — F411 Generalized anxiety disorder: Secondary | ICD-10-CM | POA: Diagnosis not present

## 2019-04-01 DIAGNOSIS — Z5181 Encounter for therapeutic drug level monitoring: Secondary | ICD-10-CM | POA: Diagnosis not present

## 2019-04-01 DIAGNOSIS — M5441 Lumbago with sciatica, right side: Secondary | ICD-10-CM | POA: Diagnosis not present

## 2019-04-01 DIAGNOSIS — F1190 Opioid use, unspecified, uncomplicated: Secondary | ICD-10-CM | POA: Diagnosis not present

## 2019-04-01 DIAGNOSIS — M5137 Other intervertebral disc degeneration, lumbosacral region: Secondary | ICD-10-CM | POA: Diagnosis not present

## 2019-04-01 DIAGNOSIS — Z79891 Long term (current) use of opiate analgesic: Secondary | ICD-10-CM | POA: Diagnosis not present

## 2019-04-01 DIAGNOSIS — Z760 Encounter for issue of repeat prescription: Secondary | ICD-10-CM | POA: Diagnosis not present

## 2019-04-01 DIAGNOSIS — G8929 Other chronic pain: Secondary | ICD-10-CM | POA: Diagnosis not present

## 2019-04-09 DIAGNOSIS — L821 Other seborrheic keratosis: Secondary | ICD-10-CM | POA: Diagnosis not present

## 2019-04-09 DIAGNOSIS — L570 Actinic keratosis: Secondary | ICD-10-CM | POA: Diagnosis not present

## 2019-04-09 DIAGNOSIS — C44612 Basal cell carcinoma of skin of right upper limb, including shoulder: Secondary | ICD-10-CM | POA: Diagnosis not present

## 2019-04-29 DIAGNOSIS — G8929 Other chronic pain: Secondary | ICD-10-CM | POA: Diagnosis not present

## 2019-04-29 DIAGNOSIS — Z760 Encounter for issue of repeat prescription: Secondary | ICD-10-CM | POA: Diagnosis not present

## 2019-04-29 DIAGNOSIS — F1190 Opioid use, unspecified, uncomplicated: Secondary | ICD-10-CM | POA: Diagnosis not present

## 2019-04-29 DIAGNOSIS — R3915 Urgency of urination: Secondary | ICD-10-CM | POA: Diagnosis not present

## 2019-04-29 DIAGNOSIS — M5441 Lumbago with sciatica, right side: Secondary | ICD-10-CM | POA: Diagnosis not present

## 2019-04-29 DIAGNOSIS — I10 Essential (primary) hypertension: Secondary | ICD-10-CM | POA: Diagnosis not present

## 2019-04-29 DIAGNOSIS — G4700 Insomnia, unspecified: Secondary | ICD-10-CM | POA: Diagnosis not present

## 2019-04-29 DIAGNOSIS — M545 Low back pain: Secondary | ICD-10-CM | POA: Diagnosis not present

## 2019-04-29 DIAGNOSIS — M5137 Other intervertebral disc degeneration, lumbosacral region: Secondary | ICD-10-CM | POA: Diagnosis not present

## 2019-04-29 DIAGNOSIS — Z5181 Encounter for therapeutic drug level monitoring: Secondary | ICD-10-CM | POA: Diagnosis not present

## 2019-04-29 DIAGNOSIS — F411 Generalized anxiety disorder: Secondary | ICD-10-CM | POA: Diagnosis not present

## 2019-05-07 DIAGNOSIS — I10 Essential (primary) hypertension: Secondary | ICD-10-CM | POA: Diagnosis not present

## 2019-05-07 DIAGNOSIS — M48062 Spinal stenosis, lumbar region with neurogenic claudication: Secondary | ICD-10-CM | POA: Diagnosis not present

## 2019-05-07 DIAGNOSIS — M5136 Other intervertebral disc degeneration, lumbar region: Secondary | ICD-10-CM | POA: Diagnosis not present

## 2019-05-07 DIAGNOSIS — M4316 Spondylolisthesis, lumbar region: Secondary | ICD-10-CM | POA: Diagnosis not present

## 2019-05-30 DIAGNOSIS — Z5181 Encounter for therapeutic drug level monitoring: Secondary | ICD-10-CM | POA: Diagnosis not present

## 2019-05-30 DIAGNOSIS — G8929 Other chronic pain: Secondary | ICD-10-CM | POA: Diagnosis not present

## 2019-05-30 DIAGNOSIS — I10 Essential (primary) hypertension: Secondary | ICD-10-CM | POA: Diagnosis not present

## 2019-05-30 DIAGNOSIS — F1190 Opioid use, unspecified, uncomplicated: Secondary | ICD-10-CM | POA: Diagnosis not present

## 2019-05-30 DIAGNOSIS — Z79891 Long term (current) use of opiate analgesic: Secondary | ICD-10-CM | POA: Diagnosis not present

## 2019-05-30 DIAGNOSIS — F411 Generalized anxiety disorder: Secondary | ICD-10-CM | POA: Diagnosis not present

## 2019-05-30 DIAGNOSIS — M5441 Lumbago with sciatica, right side: Secondary | ICD-10-CM | POA: Diagnosis not present

## 2019-05-30 DIAGNOSIS — M545 Low back pain: Secondary | ICD-10-CM | POA: Diagnosis not present

## 2019-05-30 DIAGNOSIS — M5137 Other intervertebral disc degeneration, lumbosacral region: Secondary | ICD-10-CM | POA: Diagnosis not present

## 2019-05-30 DIAGNOSIS — Z760 Encounter for issue of repeat prescription: Secondary | ICD-10-CM | POA: Diagnosis not present

## 2019-06-27 DIAGNOSIS — Z79891 Long term (current) use of opiate analgesic: Secondary | ICD-10-CM | POA: Diagnosis not present

## 2019-06-27 DIAGNOSIS — Z760 Encounter for issue of repeat prescription: Secondary | ICD-10-CM | POA: Diagnosis not present

## 2019-06-27 DIAGNOSIS — I10 Essential (primary) hypertension: Secondary | ICD-10-CM | POA: Diagnosis not present

## 2019-06-27 DIAGNOSIS — F1190 Opioid use, unspecified, uncomplicated: Secondary | ICD-10-CM | POA: Diagnosis not present

## 2019-06-27 DIAGNOSIS — M545 Low back pain: Secondary | ICD-10-CM | POA: Diagnosis not present

## 2019-06-27 DIAGNOSIS — G8929 Other chronic pain: Secondary | ICD-10-CM | POA: Diagnosis not present

## 2019-07-26 DIAGNOSIS — G8929 Other chronic pain: Secondary | ICD-10-CM | POA: Diagnosis not present

## 2019-07-26 DIAGNOSIS — M5441 Lumbago with sciatica, right side: Secondary | ICD-10-CM | POA: Diagnosis not present

## 2019-07-26 DIAGNOSIS — F1190 Opioid use, unspecified, uncomplicated: Secondary | ICD-10-CM | POA: Diagnosis not present

## 2019-07-26 DIAGNOSIS — F411 Generalized anxiety disorder: Secondary | ICD-10-CM | POA: Diagnosis not present

## 2019-07-26 DIAGNOSIS — I10 Essential (primary) hypertension: Secondary | ICD-10-CM | POA: Diagnosis not present

## 2019-07-26 DIAGNOSIS — M79673 Pain in unspecified foot: Secondary | ICD-10-CM | POA: Diagnosis not present

## 2019-07-26 DIAGNOSIS — Z79891 Long term (current) use of opiate analgesic: Secondary | ICD-10-CM | POA: Diagnosis not present

## 2019-07-26 DIAGNOSIS — M5137 Other intervertebral disc degeneration, lumbosacral region: Secondary | ICD-10-CM | POA: Diagnosis not present

## 2019-07-26 DIAGNOSIS — Z760 Encounter for issue of repeat prescription: Secondary | ICD-10-CM | POA: Diagnosis not present

## 2019-07-26 DIAGNOSIS — Z5181 Encounter for therapeutic drug level monitoring: Secondary | ICD-10-CM | POA: Diagnosis not present

## 2019-07-26 DIAGNOSIS — E291 Testicular hypofunction: Secondary | ICD-10-CM | POA: Diagnosis not present

## 2019-07-26 DIAGNOSIS — M545 Low back pain: Secondary | ICD-10-CM | POA: Diagnosis not present

## 2019-08-08 DIAGNOSIS — L570 Actinic keratosis: Secondary | ICD-10-CM | POA: Diagnosis not present

## 2019-08-08 DIAGNOSIS — L578 Other skin changes due to chronic exposure to nonionizing radiation: Secondary | ICD-10-CM | POA: Diagnosis not present

## 2019-08-08 DIAGNOSIS — L821 Other seborrheic keratosis: Secondary | ICD-10-CM | POA: Diagnosis not present

## 2019-08-26 DIAGNOSIS — Z79891 Long term (current) use of opiate analgesic: Secondary | ICD-10-CM | POA: Diagnosis not present

## 2019-08-26 DIAGNOSIS — I10 Essential (primary) hypertension: Secondary | ICD-10-CM | POA: Diagnosis not present

## 2019-08-26 DIAGNOSIS — Z760 Encounter for issue of repeat prescription: Secondary | ICD-10-CM | POA: Diagnosis not present

## 2019-08-26 DIAGNOSIS — G4700 Insomnia, unspecified: Secondary | ICD-10-CM | POA: Diagnosis not present

## 2019-08-26 DIAGNOSIS — B179 Acute viral hepatitis, unspecified: Secondary | ICD-10-CM | POA: Diagnosis not present

## 2019-08-26 DIAGNOSIS — R29898 Other symptoms and signs involving the musculoskeletal system: Secondary | ICD-10-CM | POA: Diagnosis not present

## 2019-08-26 DIAGNOSIS — Z5181 Encounter for therapeutic drug level monitoring: Secondary | ICD-10-CM | POA: Diagnosis not present

## 2019-08-26 DIAGNOSIS — M5137 Other intervertebral disc degeneration, lumbosacral region: Secondary | ICD-10-CM | POA: Diagnosis not present

## 2019-08-26 DIAGNOSIS — G8929 Other chronic pain: Secondary | ICD-10-CM | POA: Diagnosis not present

## 2019-08-26 DIAGNOSIS — F411 Generalized anxiety disorder: Secondary | ICD-10-CM | POA: Diagnosis not present

## 2019-08-26 DIAGNOSIS — M545 Low back pain: Secondary | ICD-10-CM | POA: Diagnosis not present

## 2019-08-26 DIAGNOSIS — F1190 Opioid use, unspecified, uncomplicated: Secondary | ICD-10-CM | POA: Diagnosis not present

## 2019-09-25 DIAGNOSIS — M5137 Other intervertebral disc degeneration, lumbosacral region: Secondary | ICD-10-CM | POA: Diagnosis not present

## 2019-09-25 DIAGNOSIS — Z760 Encounter for issue of repeat prescription: Secondary | ICD-10-CM | POA: Diagnosis not present

## 2019-09-25 DIAGNOSIS — Z79891 Long term (current) use of opiate analgesic: Secondary | ICD-10-CM | POA: Diagnosis not present

## 2019-09-25 DIAGNOSIS — Z5181 Encounter for therapeutic drug level monitoring: Secondary | ICD-10-CM | POA: Diagnosis not present

## 2019-09-25 DIAGNOSIS — M5431 Sciatica, right side: Secondary | ICD-10-CM | POA: Diagnosis not present

## 2019-09-25 DIAGNOSIS — I10 Essential (primary) hypertension: Secondary | ICD-10-CM | POA: Diagnosis not present

## 2019-09-25 DIAGNOSIS — G8929 Other chronic pain: Secondary | ICD-10-CM | POA: Diagnosis not present

## 2019-09-25 DIAGNOSIS — M545 Low back pain: Secondary | ICD-10-CM | POA: Diagnosis not present

## 2019-09-25 DIAGNOSIS — F1190 Opioid use, unspecified, uncomplicated: Secondary | ICD-10-CM | POA: Diagnosis not present

## 2019-10-02 IMAGING — CR DG LUMBAR SPINE 1V
1 series · 1 of 1 positions shown · non-contrast
Comparison: None.

CLINICAL DATA: 61-year-old male with a history of lumbar fusion.

EXAM:
LUMBAR SPINE - 1 VIEW

[lateral]
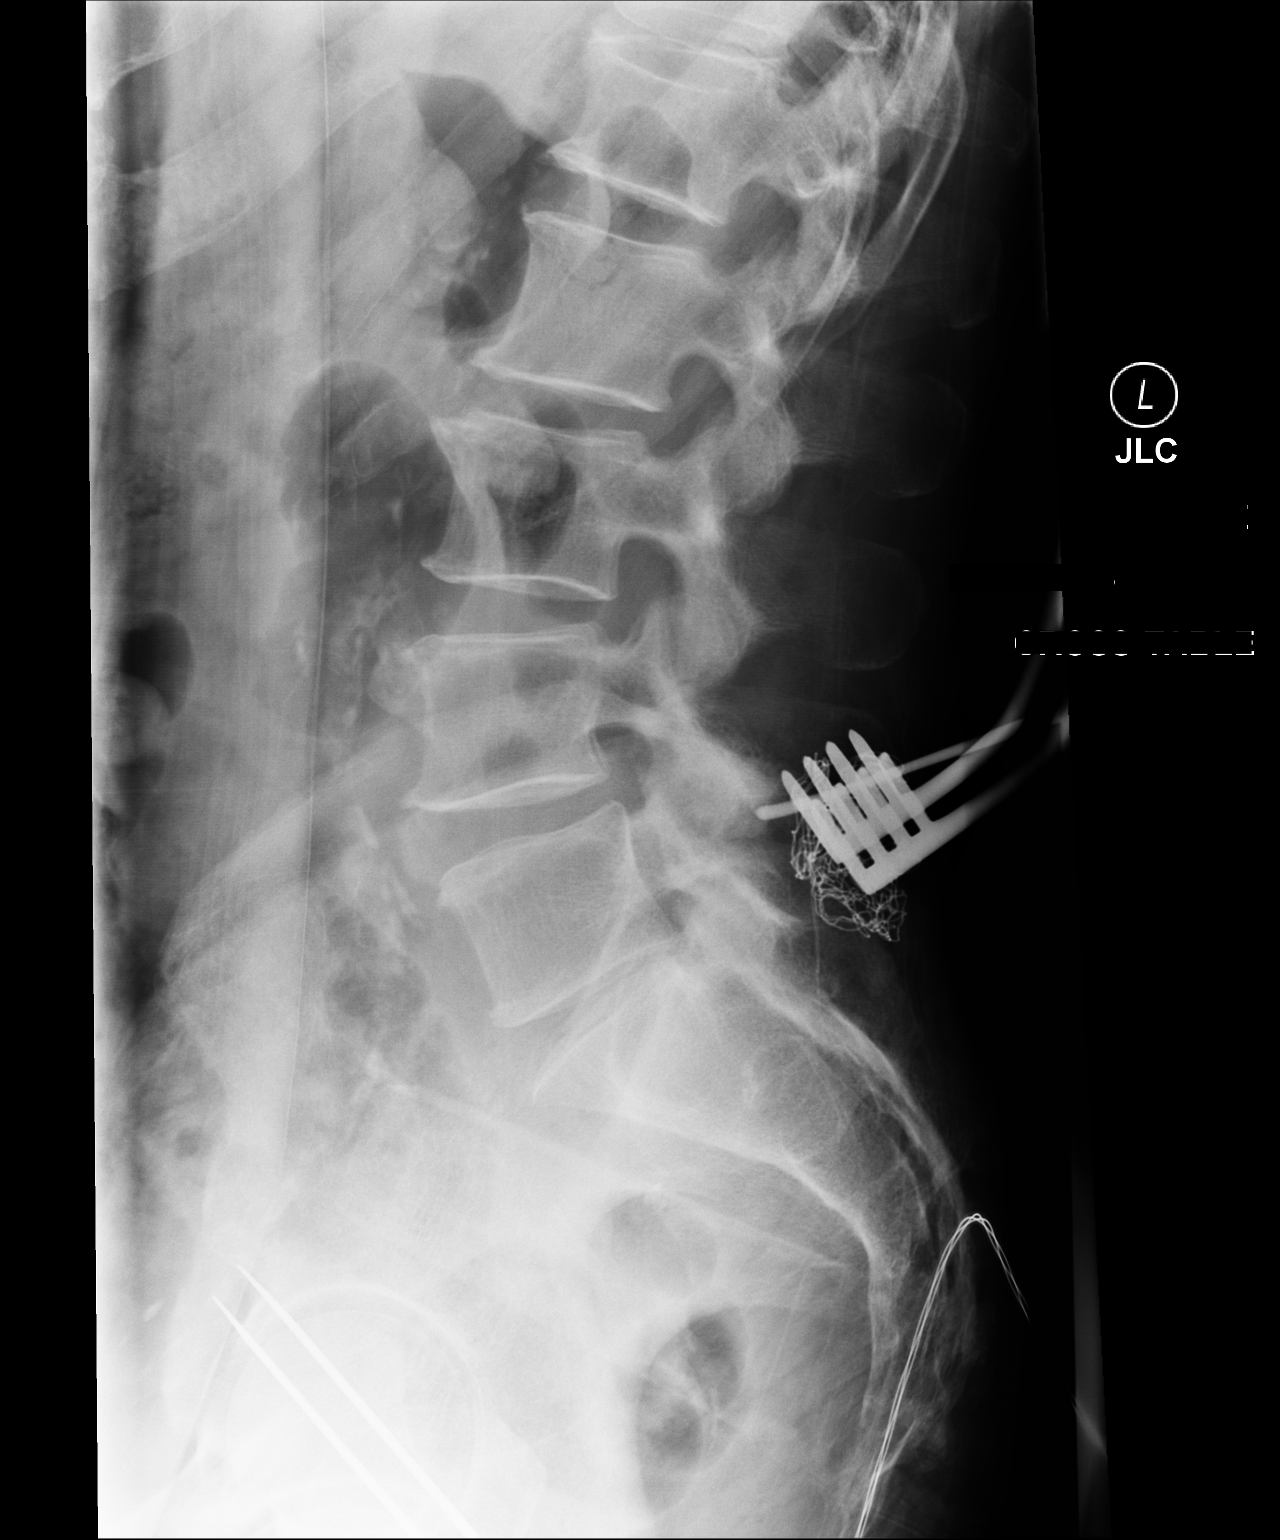

[1 of 1 positions shown; findings below may reference images not displayed]

FINDINGS: Limited images during lumbar fusion.

Cross-table lateral demonstrates tissue re- tractor overlying the L5
spinous process with surgical probe at the level of the L5 pedicle.
IMPRESSION: Single cross-table lateral of the lumbar spine during surgery, with
the surgical probe identifying the L5 pedicle as above. Please refer
to the dictated operative report for full details of intraoperative
findings and procedure.

## 2019-10-25 DIAGNOSIS — Z79891 Long term (current) use of opiate analgesic: Secondary | ICD-10-CM | POA: Diagnosis not present

## 2019-10-25 DIAGNOSIS — Z760 Encounter for issue of repeat prescription: Secondary | ICD-10-CM | POA: Diagnosis not present

## 2019-10-25 DIAGNOSIS — F1190 Opioid use, unspecified, uncomplicated: Secondary | ICD-10-CM | POA: Diagnosis not present

## 2019-10-25 DIAGNOSIS — Z5181 Encounter for therapeutic drug level monitoring: Secondary | ICD-10-CM | POA: Diagnosis not present

## 2019-10-25 DIAGNOSIS — I10 Essential (primary) hypertension: Secondary | ICD-10-CM | POA: Diagnosis not present

## 2019-10-25 DIAGNOSIS — G8929 Other chronic pain: Secondary | ICD-10-CM | POA: Diagnosis not present

## 2019-10-25 DIAGNOSIS — M5431 Sciatica, right side: Secondary | ICD-10-CM | POA: Diagnosis not present

## 2019-10-25 DIAGNOSIS — M5137 Other intervertebral disc degeneration, lumbosacral region: Secondary | ICD-10-CM | POA: Diagnosis not present

## 2019-10-25 DIAGNOSIS — M545 Low back pain: Secondary | ICD-10-CM | POA: Diagnosis not present

## 2019-10-28 DIAGNOSIS — M5431 Sciatica, right side: Secondary | ICD-10-CM | POA: Diagnosis not present

## 2019-10-28 DIAGNOSIS — M5137 Other intervertebral disc degeneration, lumbosacral region: Secondary | ICD-10-CM | POA: Diagnosis not present

## 2019-10-28 DIAGNOSIS — M545 Low back pain: Secondary | ICD-10-CM | POA: Diagnosis not present

## 2019-10-28 DIAGNOSIS — G8929 Other chronic pain: Secondary | ICD-10-CM | POA: Diagnosis not present

## 2019-11-25 DIAGNOSIS — I10 Essential (primary) hypertension: Secondary | ICD-10-CM | POA: Diagnosis not present

## 2019-11-25 DIAGNOSIS — M5137 Other intervertebral disc degeneration, lumbosacral region: Secondary | ICD-10-CM | POA: Diagnosis not present

## 2019-11-25 DIAGNOSIS — G8929 Other chronic pain: Secondary | ICD-10-CM | POA: Diagnosis not present

## 2019-11-25 DIAGNOSIS — G629 Polyneuropathy, unspecified: Secondary | ICD-10-CM | POA: Diagnosis not present

## 2019-11-25 DIAGNOSIS — F1190 Opioid use, unspecified, uncomplicated: Secondary | ICD-10-CM | POA: Diagnosis not present

## 2019-11-25 DIAGNOSIS — Z79891 Long term (current) use of opiate analgesic: Secondary | ICD-10-CM | POA: Diagnosis not present

## 2019-11-25 DIAGNOSIS — M5431 Sciatica, right side: Secondary | ICD-10-CM | POA: Diagnosis not present

## 2019-11-25 DIAGNOSIS — Z5181 Encounter for therapeutic drug level monitoring: Secondary | ICD-10-CM | POA: Diagnosis not present

## 2019-11-25 DIAGNOSIS — G2581 Restless legs syndrome: Secondary | ICD-10-CM | POA: Diagnosis not present

## 2019-11-25 DIAGNOSIS — Z760 Encounter for issue of repeat prescription: Secondary | ICD-10-CM | POA: Diagnosis not present

## 2019-12-16 DIAGNOSIS — M9905 Segmental and somatic dysfunction of pelvic region: Secondary | ICD-10-CM | POA: Diagnosis not present

## 2019-12-16 DIAGNOSIS — M461 Sacroiliitis, not elsewhere classified: Secondary | ICD-10-CM | POA: Diagnosis not present

## 2019-12-16 DIAGNOSIS — M9902 Segmental and somatic dysfunction of thoracic region: Secondary | ICD-10-CM | POA: Diagnosis not present

## 2019-12-16 DIAGNOSIS — M4726 Other spondylosis with radiculopathy, lumbar region: Secondary | ICD-10-CM | POA: Diagnosis not present

## 2019-12-16 DIAGNOSIS — M4326 Fusion of spine, lumbar region: Secondary | ICD-10-CM | POA: Diagnosis not present

## 2019-12-16 DIAGNOSIS — M9903 Segmental and somatic dysfunction of lumbar region: Secondary | ICD-10-CM | POA: Diagnosis not present

## 2019-12-18 DIAGNOSIS — M9902 Segmental and somatic dysfunction of thoracic region: Secondary | ICD-10-CM | POA: Diagnosis not present

## 2019-12-18 DIAGNOSIS — M9905 Segmental and somatic dysfunction of pelvic region: Secondary | ICD-10-CM | POA: Diagnosis not present

## 2019-12-18 DIAGNOSIS — M9903 Segmental and somatic dysfunction of lumbar region: Secondary | ICD-10-CM | POA: Diagnosis not present

## 2019-12-18 DIAGNOSIS — M461 Sacroiliitis, not elsewhere classified: Secondary | ICD-10-CM | POA: Diagnosis not present

## 2019-12-18 DIAGNOSIS — M4726 Other spondylosis with radiculopathy, lumbar region: Secondary | ICD-10-CM | POA: Diagnosis not present

## 2019-12-18 DIAGNOSIS — M4326 Fusion of spine, lumbar region: Secondary | ICD-10-CM | POA: Diagnosis not present

## 2019-12-19 DIAGNOSIS — M461 Sacroiliitis, not elsewhere classified: Secondary | ICD-10-CM | POA: Diagnosis not present

## 2019-12-19 DIAGNOSIS — M4726 Other spondylosis with radiculopathy, lumbar region: Secondary | ICD-10-CM | POA: Diagnosis not present

## 2019-12-19 DIAGNOSIS — M9905 Segmental and somatic dysfunction of pelvic region: Secondary | ICD-10-CM | POA: Diagnosis not present

## 2019-12-19 DIAGNOSIS — M9903 Segmental and somatic dysfunction of lumbar region: Secondary | ICD-10-CM | POA: Diagnosis not present

## 2019-12-19 DIAGNOSIS — M9902 Segmental and somatic dysfunction of thoracic region: Secondary | ICD-10-CM | POA: Diagnosis not present

## 2019-12-19 DIAGNOSIS — M4326 Fusion of spine, lumbar region: Secondary | ICD-10-CM | POA: Diagnosis not present

## 2019-12-23 DIAGNOSIS — M461 Sacroiliitis, not elsewhere classified: Secondary | ICD-10-CM | POA: Diagnosis not present

## 2019-12-23 DIAGNOSIS — M4326 Fusion of spine, lumbar region: Secondary | ICD-10-CM | POA: Diagnosis not present

## 2019-12-23 DIAGNOSIS — M9902 Segmental and somatic dysfunction of thoracic region: Secondary | ICD-10-CM | POA: Diagnosis not present

## 2019-12-23 DIAGNOSIS — M9905 Segmental and somatic dysfunction of pelvic region: Secondary | ICD-10-CM | POA: Diagnosis not present

## 2019-12-23 DIAGNOSIS — M9903 Segmental and somatic dysfunction of lumbar region: Secondary | ICD-10-CM | POA: Diagnosis not present

## 2019-12-23 DIAGNOSIS — M4726 Other spondylosis with radiculopathy, lumbar region: Secondary | ICD-10-CM | POA: Diagnosis not present

## 2019-12-25 DIAGNOSIS — M5137 Other intervertebral disc degeneration, lumbosacral region: Secondary | ICD-10-CM | POA: Diagnosis not present

## 2019-12-25 DIAGNOSIS — G2581 Restless legs syndrome: Secondary | ICD-10-CM | POA: Diagnosis not present

## 2019-12-25 DIAGNOSIS — M9902 Segmental and somatic dysfunction of thoracic region: Secondary | ICD-10-CM | POA: Diagnosis not present

## 2019-12-25 DIAGNOSIS — F1190 Opioid use, unspecified, uncomplicated: Secondary | ICD-10-CM | POA: Diagnosis not present

## 2019-12-25 DIAGNOSIS — M4726 Other spondylosis with radiculopathy, lumbar region: Secondary | ICD-10-CM | POA: Diagnosis not present

## 2019-12-25 DIAGNOSIS — M461 Sacroiliitis, not elsewhere classified: Secondary | ICD-10-CM | POA: Diagnosis not present

## 2019-12-25 DIAGNOSIS — G8929 Other chronic pain: Secondary | ICD-10-CM | POA: Diagnosis not present

## 2019-12-25 DIAGNOSIS — Z79891 Long term (current) use of opiate analgesic: Secondary | ICD-10-CM | POA: Diagnosis not present

## 2019-12-25 DIAGNOSIS — G629 Polyneuropathy, unspecified: Secondary | ICD-10-CM | POA: Diagnosis not present

## 2019-12-25 DIAGNOSIS — Z5181 Encounter for therapeutic drug level monitoring: Secondary | ICD-10-CM | POA: Diagnosis not present

## 2019-12-25 DIAGNOSIS — M4326 Fusion of spine, lumbar region: Secondary | ICD-10-CM | POA: Diagnosis not present

## 2019-12-25 DIAGNOSIS — M9905 Segmental and somatic dysfunction of pelvic region: Secondary | ICD-10-CM | POA: Diagnosis not present

## 2019-12-25 DIAGNOSIS — I10 Essential (primary) hypertension: Secondary | ICD-10-CM | POA: Diagnosis not present

## 2019-12-25 DIAGNOSIS — M9903 Segmental and somatic dysfunction of lumbar region: Secondary | ICD-10-CM | POA: Diagnosis not present

## 2019-12-25 DIAGNOSIS — M5431 Sciatica, right side: Secondary | ICD-10-CM | POA: Diagnosis not present

## 2019-12-25 DIAGNOSIS — Z760 Encounter for issue of repeat prescription: Secondary | ICD-10-CM | POA: Diagnosis not present

## 2019-12-31 DIAGNOSIS — M9902 Segmental and somatic dysfunction of thoracic region: Secondary | ICD-10-CM | POA: Diagnosis not present

## 2019-12-31 DIAGNOSIS — M461 Sacroiliitis, not elsewhere classified: Secondary | ICD-10-CM | POA: Diagnosis not present

## 2019-12-31 DIAGNOSIS — M9903 Segmental and somatic dysfunction of lumbar region: Secondary | ICD-10-CM | POA: Diagnosis not present

## 2019-12-31 DIAGNOSIS — M9905 Segmental and somatic dysfunction of pelvic region: Secondary | ICD-10-CM | POA: Diagnosis not present

## 2019-12-31 DIAGNOSIS — M4726 Other spondylosis with radiculopathy, lumbar region: Secondary | ICD-10-CM | POA: Diagnosis not present

## 2019-12-31 DIAGNOSIS — M4326 Fusion of spine, lumbar region: Secondary | ICD-10-CM | POA: Diagnosis not present

## 2020-01-02 DIAGNOSIS — M4326 Fusion of spine, lumbar region: Secondary | ICD-10-CM | POA: Diagnosis not present

## 2020-01-02 DIAGNOSIS — M9905 Segmental and somatic dysfunction of pelvic region: Secondary | ICD-10-CM | POA: Diagnosis not present

## 2020-01-02 DIAGNOSIS — M4726 Other spondylosis with radiculopathy, lumbar region: Secondary | ICD-10-CM | POA: Diagnosis not present

## 2020-01-02 DIAGNOSIS — M9902 Segmental and somatic dysfunction of thoracic region: Secondary | ICD-10-CM | POA: Diagnosis not present

## 2020-01-02 DIAGNOSIS — M461 Sacroiliitis, not elsewhere classified: Secondary | ICD-10-CM | POA: Diagnosis not present

## 2020-01-02 DIAGNOSIS — M9903 Segmental and somatic dysfunction of lumbar region: Secondary | ICD-10-CM | POA: Diagnosis not present

## 2020-01-07 DIAGNOSIS — M9905 Segmental and somatic dysfunction of pelvic region: Secondary | ICD-10-CM | POA: Diagnosis not present

## 2020-01-07 DIAGNOSIS — M4726 Other spondylosis with radiculopathy, lumbar region: Secondary | ICD-10-CM | POA: Diagnosis not present

## 2020-01-07 DIAGNOSIS — M461 Sacroiliitis, not elsewhere classified: Secondary | ICD-10-CM | POA: Diagnosis not present

## 2020-01-07 DIAGNOSIS — M4326 Fusion of spine, lumbar region: Secondary | ICD-10-CM | POA: Diagnosis not present

## 2020-01-07 DIAGNOSIS — M9903 Segmental and somatic dysfunction of lumbar region: Secondary | ICD-10-CM | POA: Diagnosis not present

## 2020-01-07 DIAGNOSIS — M9902 Segmental and somatic dysfunction of thoracic region: Secondary | ICD-10-CM | POA: Diagnosis not present

## 2020-01-09 DIAGNOSIS — M9905 Segmental and somatic dysfunction of pelvic region: Secondary | ICD-10-CM | POA: Diagnosis not present

## 2020-01-09 DIAGNOSIS — M4726 Other spondylosis with radiculopathy, lumbar region: Secondary | ICD-10-CM | POA: Diagnosis not present

## 2020-01-09 DIAGNOSIS — M9902 Segmental and somatic dysfunction of thoracic region: Secondary | ICD-10-CM | POA: Diagnosis not present

## 2020-01-09 DIAGNOSIS — M461 Sacroiliitis, not elsewhere classified: Secondary | ICD-10-CM | POA: Diagnosis not present

## 2020-01-09 DIAGNOSIS — M9903 Segmental and somatic dysfunction of lumbar region: Secondary | ICD-10-CM | POA: Diagnosis not present

## 2020-01-09 DIAGNOSIS — M4326 Fusion of spine, lumbar region: Secondary | ICD-10-CM | POA: Diagnosis not present

## 2020-01-14 DIAGNOSIS — M9905 Segmental and somatic dysfunction of pelvic region: Secondary | ICD-10-CM | POA: Diagnosis not present

## 2020-01-14 DIAGNOSIS — M461 Sacroiliitis, not elsewhere classified: Secondary | ICD-10-CM | POA: Diagnosis not present

## 2020-01-14 DIAGNOSIS — M4726 Other spondylosis with radiculopathy, lumbar region: Secondary | ICD-10-CM | POA: Diagnosis not present

## 2020-01-14 DIAGNOSIS — M4326 Fusion of spine, lumbar region: Secondary | ICD-10-CM | POA: Diagnosis not present

## 2020-01-14 DIAGNOSIS — M9903 Segmental and somatic dysfunction of lumbar region: Secondary | ICD-10-CM | POA: Diagnosis not present

## 2020-01-14 DIAGNOSIS — M9902 Segmental and somatic dysfunction of thoracic region: Secondary | ICD-10-CM | POA: Diagnosis not present

## 2020-01-16 DIAGNOSIS — M4326 Fusion of spine, lumbar region: Secondary | ICD-10-CM | POA: Diagnosis not present

## 2020-01-16 DIAGNOSIS — M9905 Segmental and somatic dysfunction of pelvic region: Secondary | ICD-10-CM | POA: Diagnosis not present

## 2020-01-16 DIAGNOSIS — M9902 Segmental and somatic dysfunction of thoracic region: Secondary | ICD-10-CM | POA: Diagnosis not present

## 2020-01-16 DIAGNOSIS — M9903 Segmental and somatic dysfunction of lumbar region: Secondary | ICD-10-CM | POA: Diagnosis not present

## 2020-01-16 DIAGNOSIS — M4726 Other spondylosis with radiculopathy, lumbar region: Secondary | ICD-10-CM | POA: Diagnosis not present

## 2020-01-16 DIAGNOSIS — M461 Sacroiliitis, not elsewhere classified: Secondary | ICD-10-CM | POA: Diagnosis not present

## 2020-01-22 DIAGNOSIS — Z79891 Long term (current) use of opiate analgesic: Secondary | ICD-10-CM | POA: Diagnosis not present

## 2020-01-22 DIAGNOSIS — G2581 Restless legs syndrome: Secondary | ICD-10-CM | POA: Diagnosis not present

## 2020-01-22 DIAGNOSIS — M5137 Other intervertebral disc degeneration, lumbosacral region: Secondary | ICD-10-CM | POA: Diagnosis not present

## 2020-01-22 DIAGNOSIS — Z760 Encounter for issue of repeat prescription: Secondary | ICD-10-CM | POA: Diagnosis not present

## 2020-01-22 DIAGNOSIS — Z5181 Encounter for therapeutic drug level monitoring: Secondary | ICD-10-CM | POA: Diagnosis not present

## 2020-01-22 DIAGNOSIS — Z79899 Other long term (current) drug therapy: Secondary | ICD-10-CM | POA: Diagnosis not present

## 2020-01-22 DIAGNOSIS — G8929 Other chronic pain: Secondary | ICD-10-CM | POA: Diagnosis not present

## 2020-01-22 DIAGNOSIS — M5431 Sciatica, right side: Secondary | ICD-10-CM | POA: Diagnosis not present

## 2020-01-22 DIAGNOSIS — I10 Essential (primary) hypertension: Secondary | ICD-10-CM | POA: Diagnosis not present

## 2020-01-22 DIAGNOSIS — G629 Polyneuropathy, unspecified: Secondary | ICD-10-CM | POA: Diagnosis not present

## 2020-01-22 DIAGNOSIS — R112 Nausea with vomiting, unspecified: Secondary | ICD-10-CM | POA: Diagnosis not present

## 2020-01-22 DIAGNOSIS — R110 Nausea: Secondary | ICD-10-CM | POA: Diagnosis not present

## 2020-01-22 DIAGNOSIS — F1190 Opioid use, unspecified, uncomplicated: Secondary | ICD-10-CM | POA: Diagnosis not present

## 2020-02-10 DIAGNOSIS — C44519 Basal cell carcinoma of skin of other part of trunk: Secondary | ICD-10-CM | POA: Diagnosis not present

## 2020-02-10 DIAGNOSIS — L570 Actinic keratosis: Secondary | ICD-10-CM | POA: Diagnosis not present

## 2020-02-21 DIAGNOSIS — F1190 Opioid use, unspecified, uncomplicated: Secondary | ICD-10-CM | POA: Diagnosis not present

## 2020-02-21 DIAGNOSIS — R112 Nausea with vomiting, unspecified: Secondary | ICD-10-CM | POA: Diagnosis not present

## 2020-02-21 DIAGNOSIS — M5431 Sciatica, right side: Secondary | ICD-10-CM | POA: Diagnosis not present

## 2020-02-21 DIAGNOSIS — Z79891 Long term (current) use of opiate analgesic: Secondary | ICD-10-CM | POA: Diagnosis not present

## 2020-02-21 DIAGNOSIS — Z760 Encounter for issue of repeat prescription: Secondary | ICD-10-CM | POA: Diagnosis not present

## 2020-02-21 DIAGNOSIS — G8929 Other chronic pain: Secondary | ICD-10-CM | POA: Diagnosis not present

## 2020-02-21 DIAGNOSIS — M5137 Other intervertebral disc degeneration, lumbosacral region: Secondary | ICD-10-CM | POA: Diagnosis not present

## 2020-02-21 DIAGNOSIS — I10 Essential (primary) hypertension: Secondary | ICD-10-CM | POA: Diagnosis not present

## 2020-02-21 DIAGNOSIS — Z5181 Encounter for therapeutic drug level monitoring: Secondary | ICD-10-CM | POA: Diagnosis not present

## 2020-02-21 DIAGNOSIS — G2581 Restless legs syndrome: Secondary | ICD-10-CM | POA: Diagnosis not present

## 2020-02-21 DIAGNOSIS — Z79899 Other long term (current) drug therapy: Secondary | ICD-10-CM | POA: Diagnosis not present

## 2020-02-21 DIAGNOSIS — G629 Polyneuropathy, unspecified: Secondary | ICD-10-CM | POA: Diagnosis not present

## 2020-03-02 DIAGNOSIS — M4726 Other spondylosis with radiculopathy, lumbar region: Secondary | ICD-10-CM | POA: Diagnosis not present

## 2020-03-02 DIAGNOSIS — M9903 Segmental and somatic dysfunction of lumbar region: Secondary | ICD-10-CM | POA: Diagnosis not present

## 2020-03-02 DIAGNOSIS — M4326 Fusion of spine, lumbar region: Secondary | ICD-10-CM | POA: Diagnosis not present

## 2020-03-02 DIAGNOSIS — M9905 Segmental and somatic dysfunction of pelvic region: Secondary | ICD-10-CM | POA: Diagnosis not present

## 2020-03-02 DIAGNOSIS — M461 Sacroiliitis, not elsewhere classified: Secondary | ICD-10-CM | POA: Diagnosis not present

## 2020-03-02 DIAGNOSIS — M9902 Segmental and somatic dysfunction of thoracic region: Secondary | ICD-10-CM | POA: Diagnosis not present

## 2020-03-19 DIAGNOSIS — M9903 Segmental and somatic dysfunction of lumbar region: Secondary | ICD-10-CM | POA: Diagnosis not present

## 2020-03-19 DIAGNOSIS — M9902 Segmental and somatic dysfunction of thoracic region: Secondary | ICD-10-CM | POA: Diagnosis not present

## 2020-03-19 DIAGNOSIS — M9905 Segmental and somatic dysfunction of pelvic region: Secondary | ICD-10-CM | POA: Diagnosis not present

## 2020-03-19 DIAGNOSIS — M461 Sacroiliitis, not elsewhere classified: Secondary | ICD-10-CM | POA: Diagnosis not present

## 2020-03-19 DIAGNOSIS — M4726 Other spondylosis with radiculopathy, lumbar region: Secondary | ICD-10-CM | POA: Diagnosis not present

## 2020-03-19 DIAGNOSIS — M4326 Fusion of spine, lumbar region: Secondary | ICD-10-CM | POA: Diagnosis not present

## 2020-04-14 DIAGNOSIS — M5137 Other intervertebral disc degeneration, lumbosacral region: Secondary | ICD-10-CM | POA: Diagnosis not present

## 2020-04-14 DIAGNOSIS — Z79899 Other long term (current) drug therapy: Secondary | ICD-10-CM | POA: Diagnosis not present

## 2020-04-14 DIAGNOSIS — M5431 Sciatica, right side: Secondary | ICD-10-CM | POA: Diagnosis not present

## 2020-04-14 DIAGNOSIS — G629 Polyneuropathy, unspecified: Secondary | ICD-10-CM | POA: Diagnosis not present

## 2020-04-14 DIAGNOSIS — G2581 Restless legs syndrome: Secondary | ICD-10-CM | POA: Diagnosis not present

## 2020-04-14 DIAGNOSIS — Z760 Encounter for issue of repeat prescription: Secondary | ICD-10-CM | POA: Diagnosis not present

## 2020-06-15 DIAGNOSIS — F1190 Opioid use, unspecified, uncomplicated: Secondary | ICD-10-CM | POA: Diagnosis not present

## 2020-06-15 DIAGNOSIS — Z5181 Encounter for therapeutic drug level monitoring: Secondary | ICD-10-CM | POA: Diagnosis not present

## 2020-06-15 DIAGNOSIS — M5137 Other intervertebral disc degeneration, lumbosacral region: Secondary | ICD-10-CM | POA: Diagnosis not present

## 2020-06-15 DIAGNOSIS — M5431 Sciatica, right side: Secondary | ICD-10-CM | POA: Diagnosis not present

## 2020-06-15 DIAGNOSIS — Z79891 Long term (current) use of opiate analgesic: Secondary | ICD-10-CM | POA: Diagnosis not present

## 2020-06-15 DIAGNOSIS — Z760 Encounter for issue of repeat prescription: Secondary | ICD-10-CM | POA: Diagnosis not present

## 2020-06-15 DIAGNOSIS — G8929 Other chronic pain: Secondary | ICD-10-CM | POA: Diagnosis not present

## 2020-06-15 DIAGNOSIS — I10 Essential (primary) hypertension: Secondary | ICD-10-CM | POA: Diagnosis not present

## 2020-06-15 DIAGNOSIS — G629 Polyneuropathy, unspecified: Secondary | ICD-10-CM | POA: Diagnosis not present

## 2020-06-25 DIAGNOSIS — L03116 Cellulitis of left lower limb: Secondary | ICD-10-CM | POA: Diagnosis not present

## 2020-07-01 DIAGNOSIS — I999 Unspecified disorder of circulatory system: Secondary | ICD-10-CM | POA: Diagnosis not present

## 2020-07-01 DIAGNOSIS — I10 Essential (primary) hypertension: Secondary | ICD-10-CM | POA: Diagnosis not present

## 2020-07-01 DIAGNOSIS — T148XXA Other injury of unspecified body region, initial encounter: Secondary | ICD-10-CM | POA: Diagnosis not present

## 2020-07-14 DIAGNOSIS — I10 Essential (primary) hypertension: Secondary | ICD-10-CM | POA: Diagnosis not present

## 2020-07-14 DIAGNOSIS — R0981 Nasal congestion: Secondary | ICD-10-CM | POA: Diagnosis not present

## 2020-07-14 DIAGNOSIS — Z760 Encounter for issue of repeat prescription: Secondary | ICD-10-CM | POA: Diagnosis not present

## 2020-07-14 DIAGNOSIS — Z03818 Encounter for observation for suspected exposure to other biological agents ruled out: Secondary | ICD-10-CM | POA: Diagnosis not present

## 2020-07-14 DIAGNOSIS — M5137 Other intervertebral disc degeneration, lumbosacral region: Secondary | ICD-10-CM | POA: Diagnosis not present

## 2020-07-14 DIAGNOSIS — F1190 Opioid use, unspecified, uncomplicated: Secondary | ICD-10-CM | POA: Diagnosis not present

## 2020-07-14 DIAGNOSIS — M5431 Sciatica, right side: Secondary | ICD-10-CM | POA: Diagnosis not present

## 2020-07-14 DIAGNOSIS — G8929 Other chronic pain: Secondary | ICD-10-CM | POA: Diagnosis not present

## 2020-07-14 DIAGNOSIS — Z79891 Long term (current) use of opiate analgesic: Secondary | ICD-10-CM | POA: Diagnosis not present

## 2020-07-14 DIAGNOSIS — Z5181 Encounter for therapeutic drug level monitoring: Secondary | ICD-10-CM | POA: Diagnosis not present

## 2020-07-14 DIAGNOSIS — T148XXD Other injury of unspecified body region, subsequent encounter: Secondary | ICD-10-CM | POA: Diagnosis not present

## 2020-07-29 DIAGNOSIS — I10 Essential (primary) hypertension: Secondary | ICD-10-CM | POA: Diagnosis not present

## 2020-07-29 DIAGNOSIS — T148XXD Other injury of unspecified body region, subsequent encounter: Secondary | ICD-10-CM | POA: Diagnosis not present

## 2020-07-29 DIAGNOSIS — L539 Erythematous condition, unspecified: Secondary | ICD-10-CM | POA: Diagnosis not present

## 2020-08-10 DIAGNOSIS — C44519 Basal cell carcinoma of skin of other part of trunk: Secondary | ICD-10-CM | POA: Diagnosis not present

## 2020-08-10 DIAGNOSIS — L570 Actinic keratosis: Secondary | ICD-10-CM | POA: Diagnosis not present

## 2020-08-10 DIAGNOSIS — L578 Other skin changes due to chronic exposure to nonionizing radiation: Secondary | ICD-10-CM | POA: Diagnosis not present

## 2020-08-10 DIAGNOSIS — L821 Other seborrheic keratosis: Secondary | ICD-10-CM | POA: Diagnosis not present

## 2020-08-11 DIAGNOSIS — Z760 Encounter for issue of repeat prescription: Secondary | ICD-10-CM | POA: Diagnosis not present

## 2020-08-11 DIAGNOSIS — M5137 Other intervertebral disc degeneration, lumbosacral region: Secondary | ICD-10-CM | POA: Diagnosis not present

## 2020-08-11 DIAGNOSIS — Z79899 Other long term (current) drug therapy: Secondary | ICD-10-CM | POA: Diagnosis not present

## 2020-08-11 DIAGNOSIS — I10 Essential (primary) hypertension: Secondary | ICD-10-CM | POA: Diagnosis not present

## 2020-08-11 DIAGNOSIS — Z79891 Long term (current) use of opiate analgesic: Secondary | ICD-10-CM | POA: Diagnosis not present

## 2020-08-11 DIAGNOSIS — T148XXD Other injury of unspecified body region, subsequent encounter: Secondary | ICD-10-CM | POA: Diagnosis not present

## 2020-08-11 DIAGNOSIS — F1190 Opioid use, unspecified, uncomplicated: Secondary | ICD-10-CM | POA: Diagnosis not present

## 2020-08-11 DIAGNOSIS — Z5181 Encounter for therapeutic drug level monitoring: Secondary | ICD-10-CM | POA: Diagnosis not present

## 2020-08-11 DIAGNOSIS — G8929 Other chronic pain: Secondary | ICD-10-CM | POA: Diagnosis not present

## 2020-08-28 DIAGNOSIS — I739 Peripheral vascular disease, unspecified: Secondary | ICD-10-CM | POA: Diagnosis not present

## 2020-08-28 DIAGNOSIS — R6889 Other general symptoms and signs: Secondary | ICD-10-CM | POA: Diagnosis not present

## 2020-08-28 DIAGNOSIS — I771 Stricture of artery: Secondary | ICD-10-CM | POA: Diagnosis not present

## 2020-09-09 DIAGNOSIS — I10 Essential (primary) hypertension: Secondary | ICD-10-CM | POA: Diagnosis not present

## 2020-09-09 DIAGNOSIS — Z5181 Encounter for therapeutic drug level monitoring: Secondary | ICD-10-CM | POA: Diagnosis not present

## 2020-09-09 DIAGNOSIS — G8929 Other chronic pain: Secondary | ICD-10-CM | POA: Diagnosis not present

## 2020-09-09 DIAGNOSIS — Z760 Encounter for issue of repeat prescription: Secondary | ICD-10-CM | POA: Diagnosis not present

## 2020-09-09 DIAGNOSIS — F411 Generalized anxiety disorder: Secondary | ICD-10-CM | POA: Diagnosis not present

## 2020-09-09 DIAGNOSIS — S90812D Abrasion, left foot, subsequent encounter: Secondary | ICD-10-CM | POA: Diagnosis not present

## 2020-09-09 DIAGNOSIS — M5431 Sciatica, right side: Secondary | ICD-10-CM | POA: Diagnosis not present

## 2020-09-09 DIAGNOSIS — T148XXD Other injury of unspecified body region, subsequent encounter: Secondary | ICD-10-CM | POA: Diagnosis not present

## 2020-09-09 DIAGNOSIS — M5137 Other intervertebral disc degeneration, lumbosacral region: Secondary | ICD-10-CM | POA: Diagnosis not present

## 2020-09-09 DIAGNOSIS — Z79891 Long term (current) use of opiate analgesic: Secondary | ICD-10-CM | POA: Diagnosis not present

## 2020-09-09 DIAGNOSIS — Z79899 Other long term (current) drug therapy: Secondary | ICD-10-CM | POA: Diagnosis not present

## 2020-09-09 DIAGNOSIS — F1190 Opioid use, unspecified, uncomplicated: Secondary | ICD-10-CM | POA: Diagnosis not present

## 2020-09-09 DIAGNOSIS — Z6379 Other stressful life events affecting family and household: Secondary | ICD-10-CM | POA: Diagnosis not present

## 2020-09-14 DIAGNOSIS — I779 Disorder of arteries and arterioles, unspecified: Secondary | ICD-10-CM | POA: Diagnosis not present

## 2020-09-14 DIAGNOSIS — S91302A Unspecified open wound, left foot, initial encounter: Secondary | ICD-10-CM | POA: Diagnosis not present

## 2020-09-14 DIAGNOSIS — I739 Peripheral vascular disease, unspecified: Secondary | ICD-10-CM | POA: Diagnosis not present

## 2020-09-14 DIAGNOSIS — I70221 Atherosclerosis of native arteries of extremities with rest pain, right leg: Secondary | ICD-10-CM | POA: Diagnosis not present

## 2020-09-25 DIAGNOSIS — I739 Peripheral vascular disease, unspecified: Secondary | ICD-10-CM | POA: Diagnosis not present

## 2020-09-25 DIAGNOSIS — Z01810 Encounter for preprocedural cardiovascular examination: Secondary | ICD-10-CM | POA: Diagnosis not present

## 2020-09-25 DIAGNOSIS — I779 Disorder of arteries and arterioles, unspecified: Secondary | ICD-10-CM | POA: Diagnosis not present

## 2020-10-08 DIAGNOSIS — M5137 Other intervertebral disc degeneration, lumbosacral region: Secondary | ICD-10-CM | POA: Diagnosis not present

## 2020-10-08 DIAGNOSIS — T148XXD Other injury of unspecified body region, subsequent encounter: Secondary | ICD-10-CM | POA: Diagnosis not present

## 2020-10-08 DIAGNOSIS — I10 Essential (primary) hypertension: Secondary | ICD-10-CM | POA: Diagnosis not present

## 2020-10-08 DIAGNOSIS — S90812D Abrasion, left foot, subsequent encounter: Secondary | ICD-10-CM | POA: Diagnosis not present

## 2020-10-08 DIAGNOSIS — G8929 Other chronic pain: Secondary | ICD-10-CM | POA: Diagnosis not present

## 2020-10-08 DIAGNOSIS — Z760 Encounter for issue of repeat prescription: Secondary | ICD-10-CM | POA: Diagnosis not present

## 2020-10-08 DIAGNOSIS — Z79891 Long term (current) use of opiate analgesic: Secondary | ICD-10-CM | POA: Diagnosis not present

## 2020-10-08 DIAGNOSIS — F1190 Opioid use, unspecified, uncomplicated: Secondary | ICD-10-CM | POA: Diagnosis not present

## 2020-10-08 DIAGNOSIS — Z79899 Other long term (current) drug therapy: Secondary | ICD-10-CM | POA: Diagnosis not present

## 2020-10-08 DIAGNOSIS — M5431 Sciatica, right side: Secondary | ICD-10-CM | POA: Diagnosis not present

## 2020-10-08 DIAGNOSIS — F411 Generalized anxiety disorder: Secondary | ICD-10-CM | POA: Diagnosis not present

## 2020-10-08 DIAGNOSIS — Z5181 Encounter for therapeutic drug level monitoring: Secondary | ICD-10-CM | POA: Diagnosis not present

## 2020-11-06 DIAGNOSIS — F17210 Nicotine dependence, cigarettes, uncomplicated: Secondary | ICD-10-CM | POA: Diagnosis not present

## 2020-11-06 DIAGNOSIS — Z79891 Long term (current) use of opiate analgesic: Secondary | ICD-10-CM | POA: Diagnosis not present

## 2020-11-06 DIAGNOSIS — M5431 Sciatica, right side: Secondary | ICD-10-CM | POA: Diagnosis not present

## 2020-11-06 DIAGNOSIS — F1190 Opioid use, unspecified, uncomplicated: Secondary | ICD-10-CM | POA: Diagnosis not present

## 2020-11-06 DIAGNOSIS — T148XXD Other injury of unspecified body region, subsequent encounter: Secondary | ICD-10-CM | POA: Diagnosis not present

## 2020-11-06 DIAGNOSIS — M5137 Other intervertebral disc degeneration, lumbosacral region: Secondary | ICD-10-CM | POA: Diagnosis not present

## 2020-11-06 DIAGNOSIS — G8929 Other chronic pain: Secondary | ICD-10-CM | POA: Diagnosis not present

## 2020-11-06 DIAGNOSIS — S90812D Abrasion, left foot, subsequent encounter: Secondary | ICD-10-CM | POA: Diagnosis not present

## 2020-11-06 DIAGNOSIS — I10 Essential (primary) hypertension: Secondary | ICD-10-CM | POA: Diagnosis not present

## 2020-11-06 DIAGNOSIS — Z760 Encounter for issue of repeat prescription: Secondary | ICD-10-CM | POA: Diagnosis not present

## 2020-11-06 DIAGNOSIS — Z5181 Encounter for therapeutic drug level monitoring: Secondary | ICD-10-CM | POA: Diagnosis not present

## 2020-11-06 DIAGNOSIS — F411 Generalized anxiety disorder: Secondary | ICD-10-CM | POA: Diagnosis not present

## 2020-12-04 DIAGNOSIS — M5431 Sciatica, right side: Secondary | ICD-10-CM | POA: Diagnosis not present

## 2020-12-04 DIAGNOSIS — I999 Unspecified disorder of circulatory system: Secondary | ICD-10-CM | POA: Diagnosis not present

## 2020-12-04 DIAGNOSIS — Z79891 Long term (current) use of opiate analgesic: Secondary | ICD-10-CM | POA: Diagnosis not present

## 2020-12-04 DIAGNOSIS — Z760 Encounter for issue of repeat prescription: Secondary | ICD-10-CM | POA: Diagnosis not present

## 2020-12-04 DIAGNOSIS — I10 Essential (primary) hypertension: Secondary | ICD-10-CM | POA: Diagnosis not present

## 2020-12-04 DIAGNOSIS — F1190 Opioid use, unspecified, uncomplicated: Secondary | ICD-10-CM | POA: Diagnosis not present

## 2020-12-04 DIAGNOSIS — F17210 Nicotine dependence, cigarettes, uncomplicated: Secondary | ICD-10-CM | POA: Diagnosis not present

## 2020-12-04 DIAGNOSIS — M5137 Other intervertebral disc degeneration, lumbosacral region: Secondary | ICD-10-CM | POA: Diagnosis not present

## 2020-12-04 DIAGNOSIS — Z79899 Other long term (current) drug therapy: Secondary | ICD-10-CM | POA: Diagnosis not present

## 2020-12-04 DIAGNOSIS — Z5181 Encounter for therapeutic drug level monitoring: Secondary | ICD-10-CM | POA: Diagnosis not present

## 2020-12-04 DIAGNOSIS — G8929 Other chronic pain: Secondary | ICD-10-CM | POA: Diagnosis not present

## 2020-12-04 DIAGNOSIS — T148XXD Other injury of unspecified body region, subsequent encounter: Secondary | ICD-10-CM | POA: Diagnosis not present

## 2020-12-10 DIAGNOSIS — L570 Actinic keratosis: Secondary | ICD-10-CM | POA: Diagnosis not present

## 2020-12-10 DIAGNOSIS — L578 Other skin changes due to chronic exposure to nonionizing radiation: Secondary | ICD-10-CM | POA: Diagnosis not present

## 2020-12-10 DIAGNOSIS — L821 Other seborrheic keratosis: Secondary | ICD-10-CM | POA: Diagnosis not present

## 2021-01-01 DIAGNOSIS — T148XXD Other injury of unspecified body region, subsequent encounter: Secondary | ICD-10-CM | POA: Diagnosis not present

## 2021-01-01 DIAGNOSIS — M5137 Other intervertebral disc degeneration, lumbosacral region: Secondary | ICD-10-CM | POA: Diagnosis not present

## 2021-01-01 DIAGNOSIS — Z79891 Long term (current) use of opiate analgesic: Secondary | ICD-10-CM | POA: Diagnosis not present

## 2021-01-01 DIAGNOSIS — F17210 Nicotine dependence, cigarettes, uncomplicated: Secondary | ICD-10-CM | POA: Diagnosis not present

## 2021-01-01 DIAGNOSIS — G8929 Other chronic pain: Secondary | ICD-10-CM | POA: Diagnosis not present

## 2021-01-01 DIAGNOSIS — Z5181 Encounter for therapeutic drug level monitoring: Secondary | ICD-10-CM | POA: Diagnosis not present

## 2021-01-01 DIAGNOSIS — F1190 Opioid use, unspecified, uncomplicated: Secondary | ICD-10-CM | POA: Diagnosis not present

## 2021-01-01 DIAGNOSIS — I999 Unspecified disorder of circulatory system: Secondary | ICD-10-CM | POA: Diagnosis not present

## 2021-01-01 DIAGNOSIS — M5431 Sciatica, right side: Secondary | ICD-10-CM | POA: Diagnosis not present

## 2021-01-01 DIAGNOSIS — I10 Essential (primary) hypertension: Secondary | ICD-10-CM | POA: Diagnosis not present

## 2021-01-01 DIAGNOSIS — Z760 Encounter for issue of repeat prescription: Secondary | ICD-10-CM | POA: Diagnosis not present

## 2021-01-01 DIAGNOSIS — Z79899 Other long term (current) drug therapy: Secondary | ICD-10-CM | POA: Diagnosis not present

## 2021-01-19 DIAGNOSIS — I739 Peripheral vascular disease, unspecified: Secondary | ICD-10-CM | POA: Diagnosis not present

## 2021-01-28 DIAGNOSIS — I10 Essential (primary) hypertension: Secondary | ICD-10-CM | POA: Diagnosis not present

## 2021-01-28 DIAGNOSIS — G8929 Other chronic pain: Secondary | ICD-10-CM | POA: Diagnosis not present

## 2021-01-28 DIAGNOSIS — F1190 Opioid use, unspecified, uncomplicated: Secondary | ICD-10-CM | POA: Diagnosis not present

## 2021-01-28 DIAGNOSIS — I999 Unspecified disorder of circulatory system: Secondary | ICD-10-CM | POA: Diagnosis not present

## 2021-01-28 DIAGNOSIS — M5431 Sciatica, right side: Secondary | ICD-10-CM | POA: Diagnosis not present

## 2021-01-28 DIAGNOSIS — Z79891 Long term (current) use of opiate analgesic: Secondary | ICD-10-CM | POA: Diagnosis not present

## 2021-01-28 DIAGNOSIS — Z5181 Encounter for therapeutic drug level monitoring: Secondary | ICD-10-CM | POA: Diagnosis not present

## 2021-01-28 DIAGNOSIS — M5137 Other intervertebral disc degeneration, lumbosacral region: Secondary | ICD-10-CM | POA: Diagnosis not present

## 2021-01-28 DIAGNOSIS — Z79899 Other long term (current) drug therapy: Secondary | ICD-10-CM | POA: Diagnosis not present

## 2021-01-28 DIAGNOSIS — F17210 Nicotine dependence, cigarettes, uncomplicated: Secondary | ICD-10-CM | POA: Diagnosis not present

## 2021-01-29 DIAGNOSIS — I70262 Atherosclerosis of native arteries of extremities with gangrene, left leg: Secondary | ICD-10-CM | POA: Diagnosis not present

## 2021-01-29 DIAGNOSIS — N400 Enlarged prostate without lower urinary tract symptoms: Secondary | ICD-10-CM | POA: Diagnosis not present

## 2021-01-29 DIAGNOSIS — I110 Hypertensive heart disease with heart failure: Secondary | ICD-10-CM | POA: Diagnosis not present

## 2021-01-29 DIAGNOSIS — M868X7 Other osteomyelitis, ankle and foot: Secondary | ICD-10-CM | POA: Diagnosis not present

## 2021-01-29 DIAGNOSIS — E871 Hypo-osmolality and hyponatremia: Secondary | ICD-10-CM | POA: Diagnosis not present

## 2021-01-29 DIAGNOSIS — I509 Heart failure, unspecified: Secondary | ICD-10-CM | POA: Diagnosis not present

## 2021-01-29 DIAGNOSIS — Z01818 Encounter for other preprocedural examination: Secondary | ICD-10-CM | POA: Diagnosis not present

## 2021-01-29 DIAGNOSIS — Z01810 Encounter for preprocedural cardiovascular examination: Secondary | ICD-10-CM | POA: Diagnosis not present

## 2021-01-29 DIAGNOSIS — I498 Other specified cardiac arrhythmias: Secondary | ICD-10-CM | POA: Diagnosis not present

## 2021-01-29 DIAGNOSIS — Z7982 Long term (current) use of aspirin: Secondary | ICD-10-CM | POA: Diagnosis not present

## 2021-01-29 DIAGNOSIS — Z79899 Other long term (current) drug therapy: Secondary | ICD-10-CM | POA: Diagnosis not present

## 2021-01-29 DIAGNOSIS — L97529 Non-pressure chronic ulcer of other part of left foot with unspecified severity: Secondary | ICD-10-CM | POA: Diagnosis not present

## 2021-01-29 DIAGNOSIS — I480 Paroxysmal atrial fibrillation: Secondary | ICD-10-CM | POA: Diagnosis not present

## 2021-01-29 DIAGNOSIS — E878 Other disorders of electrolyte and fluid balance, not elsewhere classified: Secondary | ICD-10-CM | POA: Diagnosis not present

## 2021-02-03 DIAGNOSIS — I10 Essential (primary) hypertension: Secondary | ICD-10-CM | POA: Diagnosis not present

## 2021-02-03 DIAGNOSIS — Z79899 Other long term (current) drug therapy: Secondary | ICD-10-CM | POA: Diagnosis not present

## 2021-02-03 DIAGNOSIS — I70262 Atherosclerosis of native arteries of extremities with gangrene, left leg: Secondary | ICD-10-CM | POA: Diagnosis not present

## 2021-02-03 DIAGNOSIS — E878 Other disorders of electrolyte and fluid balance, not elsewhere classified: Secondary | ICD-10-CM | POA: Diagnosis not present

## 2021-02-03 DIAGNOSIS — L97529 Non-pressure chronic ulcer of other part of left foot with unspecified severity: Secondary | ICD-10-CM | POA: Diagnosis not present

## 2021-02-03 DIAGNOSIS — M19072 Primary osteoarthritis, left ankle and foot: Secondary | ICD-10-CM | POA: Diagnosis not present

## 2021-02-03 DIAGNOSIS — L859 Epidermal thickening, unspecified: Secondary | ICD-10-CM | POA: Diagnosis not present

## 2021-02-03 DIAGNOSIS — L02612 Cutaneous abscess of left foot: Secondary | ICD-10-CM | POA: Diagnosis not present

## 2021-02-03 DIAGNOSIS — I70222 Atherosclerosis of native arteries of extremities with rest pain, left leg: Secondary | ICD-10-CM | POA: Diagnosis not present

## 2021-02-03 DIAGNOSIS — I509 Heart failure, unspecified: Secondary | ICD-10-CM | POA: Diagnosis not present

## 2021-02-03 DIAGNOSIS — E871 Hypo-osmolality and hyponatremia: Secondary | ICD-10-CM | POA: Diagnosis not present

## 2021-02-03 DIAGNOSIS — M868X7 Other osteomyelitis, ankle and foot: Secondary | ICD-10-CM | POA: Diagnosis not present

## 2021-02-03 DIAGNOSIS — N400 Enlarged prostate without lower urinary tract symptoms: Secondary | ICD-10-CM | POA: Diagnosis not present

## 2021-02-03 DIAGNOSIS — I480 Paroxysmal atrial fibrillation: Secondary | ICD-10-CM | POA: Diagnosis not present

## 2021-02-03 DIAGNOSIS — I110 Hypertensive heart disease with heart failure: Secondary | ICD-10-CM | POA: Diagnosis not present

## 2021-02-03 DIAGNOSIS — Z89422 Acquired absence of other left toe(s): Secondary | ICD-10-CM | POA: Diagnosis not present

## 2021-02-03 DIAGNOSIS — I96 Gangrene, not elsewhere classified: Secondary | ICD-10-CM | POA: Diagnosis not present

## 2021-02-03 DIAGNOSIS — M86172 Other acute osteomyelitis, left ankle and foot: Secondary | ICD-10-CM | POA: Diagnosis not present

## 2021-02-03 DIAGNOSIS — Z7982 Long term (current) use of aspirin: Secondary | ICD-10-CM | POA: Diagnosis not present

## 2021-02-09 DIAGNOSIS — Z89432 Acquired absence of left foot: Secondary | ICD-10-CM | POA: Diagnosis not present

## 2021-02-09 DIAGNOSIS — Z4781 Encounter for orthopedic aftercare following surgical amputation: Secondary | ICD-10-CM | POA: Diagnosis not present

## 2021-02-09 DIAGNOSIS — I739 Peripheral vascular disease, unspecified: Secondary | ICD-10-CM | POA: Diagnosis not present

## 2021-02-24 DIAGNOSIS — Z89422 Acquired absence of other left toe(s): Secondary | ICD-10-CM | POA: Diagnosis not present

## 2021-02-24 DIAGNOSIS — Z79891 Long term (current) use of opiate analgesic: Secondary | ICD-10-CM | POA: Diagnosis not present

## 2021-02-24 DIAGNOSIS — F1190 Opioid use, unspecified, uncomplicated: Secondary | ICD-10-CM | POA: Diagnosis not present

## 2021-02-24 DIAGNOSIS — G8929 Other chronic pain: Secondary | ICD-10-CM | POA: Diagnosis not present

## 2021-02-24 DIAGNOSIS — Z760 Encounter for issue of repeat prescription: Secondary | ICD-10-CM | POA: Diagnosis not present

## 2021-02-24 DIAGNOSIS — M79675 Pain in left toe(s): Secondary | ICD-10-CM | POA: Diagnosis not present

## 2021-02-24 DIAGNOSIS — F17210 Nicotine dependence, cigarettes, uncomplicated: Secondary | ICD-10-CM | POA: Diagnosis not present

## 2021-02-24 DIAGNOSIS — I10 Essential (primary) hypertension: Secondary | ICD-10-CM | POA: Diagnosis not present

## 2021-02-24 DIAGNOSIS — Z5181 Encounter for therapeutic drug level monitoring: Secondary | ICD-10-CM | POA: Diagnosis not present

## 2021-02-24 DIAGNOSIS — Z79899 Other long term (current) drug therapy: Secondary | ICD-10-CM | POA: Diagnosis not present

## 2021-02-24 DIAGNOSIS — I999 Unspecified disorder of circulatory system: Secondary | ICD-10-CM | POA: Diagnosis not present

## 2021-02-25 DIAGNOSIS — Z95820 Peripheral vascular angioplasty status with implants and grafts: Secondary | ICD-10-CM | POA: Diagnosis not present

## 2021-02-25 DIAGNOSIS — I739 Peripheral vascular disease, unspecified: Secondary | ICD-10-CM | POA: Diagnosis not present

## 2021-03-12 DIAGNOSIS — I97638 Postprocedural hematoma of a circulatory system organ or structure following other circulatory system procedure: Secondary | ICD-10-CM | POA: Diagnosis not present

## 2021-03-12 DIAGNOSIS — Z95820 Peripheral vascular angioplasty status with implants and grafts: Secondary | ICD-10-CM | POA: Diagnosis not present

## 2021-03-12 DIAGNOSIS — I739 Peripheral vascular disease, unspecified: Secondary | ICD-10-CM | POA: Diagnosis not present

## 2021-03-15 DIAGNOSIS — I509 Heart failure, unspecified: Secondary | ICD-10-CM | POA: Diagnosis not present

## 2021-03-15 DIAGNOSIS — L089 Local infection of the skin and subcutaneous tissue, unspecified: Secondary | ICD-10-CM | POA: Diagnosis not present

## 2021-03-15 DIAGNOSIS — I739 Peripheral vascular disease, unspecified: Secondary | ICD-10-CM | POA: Diagnosis not present

## 2021-03-15 DIAGNOSIS — B9689 Other specified bacterial agents as the cause of diseases classified elsewhere: Secondary | ICD-10-CM | POA: Diagnosis not present

## 2021-03-15 DIAGNOSIS — L97526 Non-pressure chronic ulcer of other part of left foot with bone involvement without evidence of necrosis: Secondary | ICD-10-CM | POA: Diagnosis not present

## 2021-03-15 DIAGNOSIS — M19072 Primary osteoarthritis, left ankle and foot: Secondary | ICD-10-CM | POA: Diagnosis not present

## 2021-03-15 DIAGNOSIS — Z8679 Personal history of other diseases of the circulatory system: Secondary | ICD-10-CM | POA: Diagnosis not present

## 2021-03-15 DIAGNOSIS — L97529 Non-pressure chronic ulcer of other part of left foot with unspecified severity: Secondary | ICD-10-CM | POA: Diagnosis not present

## 2021-03-15 DIAGNOSIS — Z7982 Long term (current) use of aspirin: Secondary | ICD-10-CM | POA: Diagnosis not present

## 2021-03-15 DIAGNOSIS — I10 Essential (primary) hypertension: Secondary | ICD-10-CM | POA: Diagnosis not present

## 2021-03-15 DIAGNOSIS — I70262 Atherosclerosis of native arteries of extremities with gangrene, left leg: Secondary | ICD-10-CM | POA: Diagnosis not present

## 2021-03-15 DIAGNOSIS — E1152 Type 2 diabetes mellitus with diabetic peripheral angiopathy with gangrene: Secondary | ICD-10-CM | POA: Diagnosis not present

## 2021-03-15 DIAGNOSIS — M86172 Other acute osteomyelitis, left ankle and foot: Secondary | ICD-10-CM | POA: Diagnosis not present

## 2021-03-15 DIAGNOSIS — Z87891 Personal history of nicotine dependence: Secondary | ICD-10-CM | POA: Diagnosis not present

## 2021-03-15 DIAGNOSIS — M7989 Other specified soft tissue disorders: Secondary | ICD-10-CM | POA: Diagnosis not present

## 2021-03-15 DIAGNOSIS — M87078 Idiopathic aseptic necrosis of left toe(s): Secondary | ICD-10-CM | POA: Diagnosis not present

## 2021-03-15 DIAGNOSIS — G2581 Restless legs syndrome: Secondary | ICD-10-CM | POA: Diagnosis not present

## 2021-03-15 DIAGNOSIS — M869 Osteomyelitis, unspecified: Secondary | ICD-10-CM | POA: Diagnosis not present

## 2021-03-15 DIAGNOSIS — N400 Enlarged prostate without lower urinary tract symptoms: Secondary | ICD-10-CM | POA: Diagnosis not present

## 2021-03-15 DIAGNOSIS — R001 Bradycardia, unspecified: Secondary | ICD-10-CM | POA: Diagnosis not present

## 2021-03-15 DIAGNOSIS — M0080 Arthritis due to other bacteria, unspecified joint: Secondary | ICD-10-CM | POA: Diagnosis not present

## 2021-03-15 DIAGNOSIS — T8189XA Other complications of procedures, not elsewhere classified, initial encounter: Secondary | ICD-10-CM | POA: Diagnosis not present

## 2021-03-15 DIAGNOSIS — M868X7 Other osteomyelitis, ankle and foot: Secondary | ICD-10-CM | POA: Diagnosis not present

## 2021-03-15 DIAGNOSIS — E1169 Type 2 diabetes mellitus with other specified complication: Secondary | ICD-10-CM | POA: Diagnosis not present

## 2021-03-15 DIAGNOSIS — B952 Enterococcus as the cause of diseases classified elsewhere: Secondary | ICD-10-CM | POA: Diagnosis not present

## 2021-03-15 DIAGNOSIS — I110 Hypertensive heart disease with heart failure: Secondary | ICD-10-CM | POA: Diagnosis not present

## 2021-03-15 DIAGNOSIS — E11621 Type 2 diabetes mellitus with foot ulcer: Secondary | ICD-10-CM | POA: Diagnosis not present

## 2021-03-15 DIAGNOSIS — M009 Pyogenic arthritis, unspecified: Secondary | ICD-10-CM | POA: Diagnosis not present

## 2021-03-15 DIAGNOSIS — Z79899 Other long term (current) drug therapy: Secondary | ICD-10-CM | POA: Diagnosis not present

## 2021-03-15 DIAGNOSIS — E119 Type 2 diabetes mellitus without complications: Secondary | ICD-10-CM | POA: Diagnosis not present

## 2021-03-15 DIAGNOSIS — Z89422 Acquired absence of other left toe(s): Secondary | ICD-10-CM | POA: Diagnosis not present

## 2021-03-15 DIAGNOSIS — I480 Paroxysmal atrial fibrillation: Secondary | ICD-10-CM | POA: Diagnosis not present

## 2021-03-15 DIAGNOSIS — L97525 Non-pressure chronic ulcer of other part of left foot with muscle involvement without evidence of necrosis: Secondary | ICD-10-CM | POA: Diagnosis not present

## 2021-03-15 DIAGNOSIS — B954 Other streptococcus as the cause of diseases classified elsewhere: Secondary | ICD-10-CM | POA: Diagnosis not present

## 2021-03-15 DIAGNOSIS — R600 Localized edema: Secondary | ICD-10-CM | POA: Diagnosis not present

## 2021-03-23 DIAGNOSIS — Z95820 Peripheral vascular angioplasty status with implants and grafts: Secondary | ICD-10-CM | POA: Diagnosis not present

## 2021-03-23 DIAGNOSIS — I739 Peripheral vascular disease, unspecified: Secondary | ICD-10-CM | POA: Diagnosis not present

## 2021-03-23 DIAGNOSIS — L97523 Non-pressure chronic ulcer of other part of left foot with necrosis of muscle: Secondary | ICD-10-CM | POA: Diagnosis not present

## 2021-03-23 DIAGNOSIS — Z89422 Acquired absence of other left toe(s): Secondary | ICD-10-CM | POA: Diagnosis not present

## 2021-03-26 DIAGNOSIS — Z5181 Encounter for therapeutic drug level monitoring: Secondary | ICD-10-CM | POA: Diagnosis not present

## 2021-03-26 DIAGNOSIS — M4316 Spondylolisthesis, lumbar region: Secondary | ICD-10-CM | POA: Diagnosis not present

## 2021-03-26 DIAGNOSIS — M79675 Pain in left toe(s): Secondary | ICD-10-CM | POA: Diagnosis not present

## 2021-03-26 DIAGNOSIS — M5137 Other intervertebral disc degeneration, lumbosacral region: Secondary | ICD-10-CM | POA: Diagnosis not present

## 2021-03-26 DIAGNOSIS — I110 Hypertensive heart disease with heart failure: Secondary | ICD-10-CM | POA: Diagnosis not present

## 2021-03-26 DIAGNOSIS — G2581 Restless legs syndrome: Secondary | ICD-10-CM | POA: Diagnosis not present

## 2021-03-26 DIAGNOSIS — G8929 Other chronic pain: Secondary | ICD-10-CM | POA: Diagnosis not present

## 2021-03-26 DIAGNOSIS — Z79899 Other long term (current) drug therapy: Secondary | ICD-10-CM | POA: Diagnosis not present

## 2021-03-26 DIAGNOSIS — F1190 Opioid use, unspecified, uncomplicated: Secondary | ICD-10-CM | POA: Diagnosis not present

## 2021-03-26 DIAGNOSIS — Z79891 Long term (current) use of opiate analgesic: Secondary | ICD-10-CM | POA: Diagnosis not present

## 2021-03-26 DIAGNOSIS — Z951 Presence of aortocoronary bypass graft: Secondary | ICD-10-CM | POA: Diagnosis not present

## 2021-03-26 DIAGNOSIS — Z9181 History of falling: Secondary | ICD-10-CM | POA: Diagnosis not present

## 2021-03-26 DIAGNOSIS — N400 Enlarged prostate without lower urinary tract symptoms: Secondary | ICD-10-CM | POA: Diagnosis not present

## 2021-03-26 DIAGNOSIS — I739 Peripheral vascular disease, unspecified: Secondary | ICD-10-CM | POA: Diagnosis not present

## 2021-03-26 DIAGNOSIS — Z760 Encounter for issue of repeat prescription: Secondary | ICD-10-CM | POA: Diagnosis not present

## 2021-03-26 DIAGNOSIS — Z89422 Acquired absence of other left toe(s): Secondary | ICD-10-CM | POA: Diagnosis not present

## 2021-03-26 DIAGNOSIS — Z4781 Encounter for orthopedic aftercare following surgical amputation: Secondary | ICD-10-CM | POA: Diagnosis not present

## 2021-03-26 DIAGNOSIS — M5431 Sciatica, right side: Secondary | ICD-10-CM | POA: Diagnosis not present

## 2021-03-26 DIAGNOSIS — I999 Unspecified disorder of circulatory system: Secondary | ICD-10-CM | POA: Diagnosis not present

## 2021-03-26 DIAGNOSIS — I480 Paroxysmal atrial fibrillation: Secondary | ICD-10-CM | POA: Diagnosis not present

## 2021-03-26 DIAGNOSIS — F17210 Nicotine dependence, cigarettes, uncomplicated: Secondary | ICD-10-CM | POA: Diagnosis not present

## 2021-03-26 DIAGNOSIS — I10 Essential (primary) hypertension: Secondary | ICD-10-CM | POA: Diagnosis not present

## 2021-03-26 DIAGNOSIS — Z87891 Personal history of nicotine dependence: Secondary | ICD-10-CM | POA: Diagnosis not present

## 2021-03-26 DIAGNOSIS — I509 Heart failure, unspecified: Secondary | ICD-10-CM | POA: Diagnosis not present

## 2021-03-29 DIAGNOSIS — L97523 Non-pressure chronic ulcer of other part of left foot with necrosis of muscle: Secondary | ICD-10-CM | POA: Diagnosis not present

## 2021-03-29 DIAGNOSIS — I739 Peripheral vascular disease, unspecified: Secondary | ICD-10-CM | POA: Diagnosis not present

## 2021-04-05 DIAGNOSIS — I739 Peripheral vascular disease, unspecified: Secondary | ICD-10-CM | POA: Diagnosis not present

## 2021-04-05 DIAGNOSIS — L97523 Non-pressure chronic ulcer of other part of left foot with necrosis of muscle: Secondary | ICD-10-CM | POA: Diagnosis not present

## 2021-04-12 DIAGNOSIS — I739 Peripheral vascular disease, unspecified: Secondary | ICD-10-CM | POA: Diagnosis not present

## 2021-04-12 DIAGNOSIS — L97523 Non-pressure chronic ulcer of other part of left foot with necrosis of muscle: Secondary | ICD-10-CM | POA: Diagnosis not present

## 2021-04-12 DIAGNOSIS — L97522 Non-pressure chronic ulcer of other part of left foot with fat layer exposed: Secondary | ICD-10-CM | POA: Diagnosis not present

## 2021-04-14 DIAGNOSIS — Z89422 Acquired absence of other left toe(s): Secondary | ICD-10-CM | POA: Diagnosis not present

## 2021-04-14 DIAGNOSIS — I509 Heart failure, unspecified: Secondary | ICD-10-CM | POA: Diagnosis not present

## 2021-04-14 DIAGNOSIS — G2581 Restless legs syndrome: Secondary | ICD-10-CM | POA: Diagnosis not present

## 2021-04-14 DIAGNOSIS — N400 Enlarged prostate without lower urinary tract symptoms: Secondary | ICD-10-CM | POA: Diagnosis not present

## 2021-04-14 DIAGNOSIS — Z9181 History of falling: Secondary | ICD-10-CM | POA: Diagnosis not present

## 2021-04-14 DIAGNOSIS — I739 Peripheral vascular disease, unspecified: Secondary | ICD-10-CM | POA: Diagnosis not present

## 2021-04-14 DIAGNOSIS — I480 Paroxysmal atrial fibrillation: Secondary | ICD-10-CM | POA: Diagnosis not present

## 2021-04-14 DIAGNOSIS — M4316 Spondylolisthesis, lumbar region: Secondary | ICD-10-CM | POA: Diagnosis not present

## 2021-04-14 DIAGNOSIS — Z87891 Personal history of nicotine dependence: Secondary | ICD-10-CM | POA: Diagnosis not present

## 2021-04-14 DIAGNOSIS — Z951 Presence of aortocoronary bypass graft: Secondary | ICD-10-CM | POA: Diagnosis not present

## 2021-04-14 DIAGNOSIS — Z4781 Encounter for orthopedic aftercare following surgical amputation: Secondary | ICD-10-CM | POA: Diagnosis not present

## 2021-04-14 DIAGNOSIS — I110 Hypertensive heart disease with heart failure: Secondary | ICD-10-CM | POA: Diagnosis not present

## 2021-04-19 DIAGNOSIS — L97523 Non-pressure chronic ulcer of other part of left foot with necrosis of muscle: Secondary | ICD-10-CM | POA: Diagnosis not present

## 2021-04-19 DIAGNOSIS — I739 Peripheral vascular disease, unspecified: Secondary | ICD-10-CM | POA: Diagnosis not present

## 2021-04-19 DIAGNOSIS — Z951 Presence of aortocoronary bypass graft: Secondary | ICD-10-CM | POA: Diagnosis not present

## 2021-04-26 DIAGNOSIS — Z951 Presence of aortocoronary bypass graft: Secondary | ICD-10-CM | POA: Diagnosis not present

## 2021-04-26 DIAGNOSIS — I739 Peripheral vascular disease, unspecified: Secondary | ICD-10-CM | POA: Diagnosis not present

## 2021-04-26 DIAGNOSIS — L97523 Non-pressure chronic ulcer of other part of left foot with necrosis of muscle: Secondary | ICD-10-CM | POA: Diagnosis not present

## 2021-04-28 DIAGNOSIS — F17210 Nicotine dependence, cigarettes, uncomplicated: Secondary | ICD-10-CM | POA: Diagnosis not present

## 2021-04-28 DIAGNOSIS — I999 Unspecified disorder of circulatory system: Secondary | ICD-10-CM | POA: Diagnosis not present

## 2021-04-28 DIAGNOSIS — Z760 Encounter for issue of repeat prescription: Secondary | ICD-10-CM | POA: Diagnosis not present

## 2021-04-28 DIAGNOSIS — Z5181 Encounter for therapeutic drug level monitoring: Secondary | ICD-10-CM | POA: Diagnosis not present

## 2021-04-28 DIAGNOSIS — Z89422 Acquired absence of other left toe(s): Secondary | ICD-10-CM | POA: Diagnosis not present

## 2021-04-28 DIAGNOSIS — G8929 Other chronic pain: Secondary | ICD-10-CM | POA: Diagnosis not present

## 2021-04-28 DIAGNOSIS — M5137 Other intervertebral disc degeneration, lumbosacral region: Secondary | ICD-10-CM | POA: Diagnosis not present

## 2021-04-28 DIAGNOSIS — M79675 Pain in left toe(s): Secondary | ICD-10-CM | POA: Diagnosis not present

## 2021-04-28 DIAGNOSIS — M5431 Sciatica, right side: Secondary | ICD-10-CM | POA: Diagnosis not present

## 2021-04-28 DIAGNOSIS — Z79891 Long term (current) use of opiate analgesic: Secondary | ICD-10-CM | POA: Diagnosis not present

## 2021-04-28 DIAGNOSIS — I10 Essential (primary) hypertension: Secondary | ICD-10-CM | POA: Diagnosis not present

## 2021-04-28 DIAGNOSIS — F1190 Opioid use, unspecified, uncomplicated: Secondary | ICD-10-CM | POA: Diagnosis not present

## 2021-04-28 DIAGNOSIS — Z79899 Other long term (current) drug therapy: Secondary | ICD-10-CM | POA: Diagnosis not present

## 2021-05-03 DIAGNOSIS — Z951 Presence of aortocoronary bypass graft: Secondary | ICD-10-CM | POA: Diagnosis not present

## 2021-05-03 DIAGNOSIS — I739 Peripheral vascular disease, unspecified: Secondary | ICD-10-CM | POA: Diagnosis not present

## 2021-05-03 DIAGNOSIS — L97523 Non-pressure chronic ulcer of other part of left foot with necrosis of muscle: Secondary | ICD-10-CM | POA: Diagnosis not present

## 2021-05-10 DIAGNOSIS — I739 Peripheral vascular disease, unspecified: Secondary | ICD-10-CM | POA: Diagnosis not present

## 2021-05-10 DIAGNOSIS — L97523 Non-pressure chronic ulcer of other part of left foot with necrosis of muscle: Secondary | ICD-10-CM | POA: Diagnosis not present

## 2021-05-10 DIAGNOSIS — Z951 Presence of aortocoronary bypass graft: Secondary | ICD-10-CM | POA: Diagnosis not present

## 2021-10-27 IMAGING — CT CT L SPINE W/ CM
1 of 5 series · 5 of 14 positions shown, 7 images · non-contrast
Comparison: MRI of lumbar spine 01/09/2019

CLINICAL DATA: Chronic low back pain. Pain into the groin. S1
radiculopathy.
TECHNIQUE: Contiguous axial images were obtained through the Lumbar spine after
the intrathecal infusion of infusion. Coronal and sagittal
reconstructions were obtained of the axial image sets.

[Series 3: l spine soft (person_name) · axial · 0.34mm/px · z∈[-282,-129]mm · 5 of 77 slices shown, 7 images]
[im 13/77  soft-tissue]
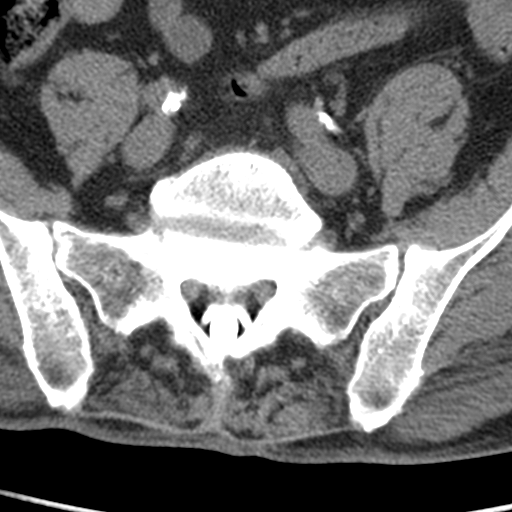
[im 13/77  bone]
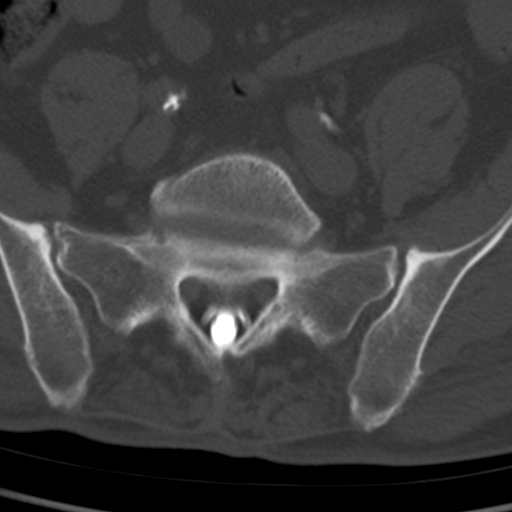
[im 26/77  bone]
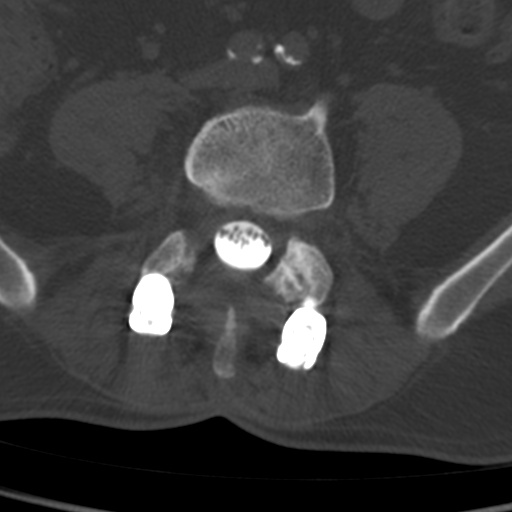
[im 39/77  bone]
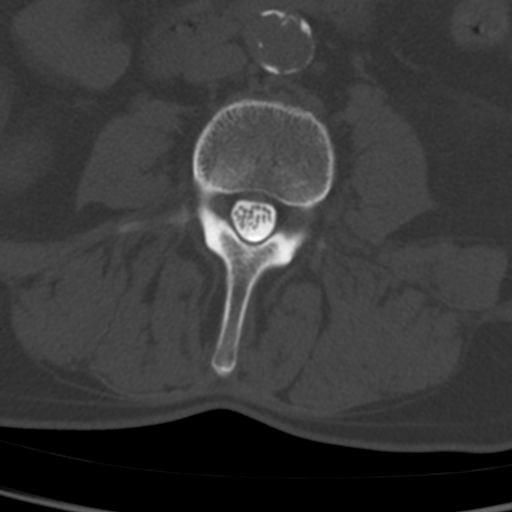
[im 51/77  bone]
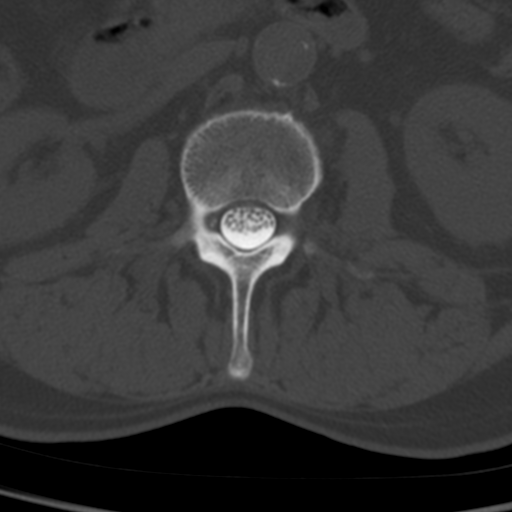
[im 64/77  soft-tissue]
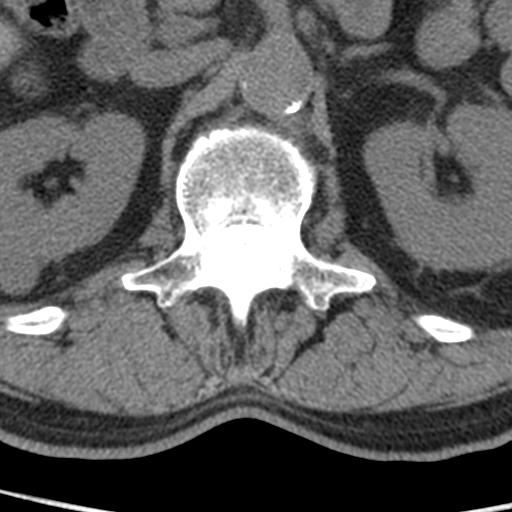
[im 64/77  bone]
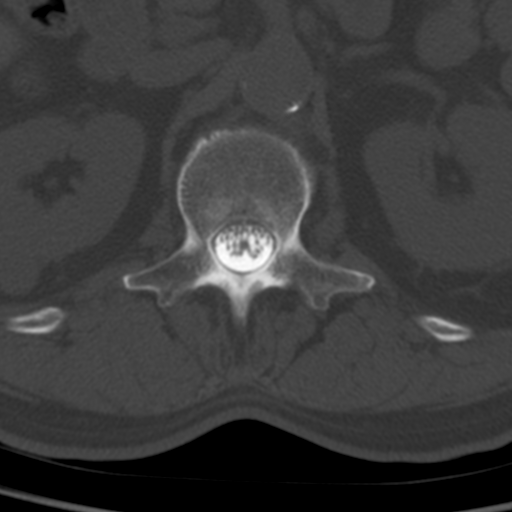

[5 of 14 positions shown; findings below may reference images not displayed]

EXAM:
LUMBAR MYELOGRAM

FLUOROSCOPY TIME:  Radiation Exposure Index (as provided by the
fluoroscopic device): 308.96 uGy*m2

PROCEDURE:
After thorough discussion of risks and benefits of the procedure
including bleeding, infection, injury to nerves, blood vessels,
adjacent structures as well as headache and CSF leak, written and
oral informed consent was obtained. Consent was obtained by Dr.
Hlokoloza Fetile. Time out form was completed.

Patient was positioned prone on the fluoroscopy table. Local
anesthesia was provided with 1% lidocaine without epinephrine after
prepped and draped in the usual sterile fashion. Puncture was
performed at L2-3 using a 3 1/2 inch 22-gauge spinal needle via left
paramedian approach. Using a single pass through the dura, the
needle was placed within the thecal sac, with return of clear CSF.
15 mL of Isovue G-TMM was injected into the thecal sac, with normal
opacification of the nerve roots and cauda equina consistent with
free flow within the subarachnoid space.

I personally performed the lumbar puncture and administered the
intrathecal contrast. I also personally supervised acquisition of
the myelogram images.
FINDINGS: LUMBAR MYELOGRAM FINDINGS:

Lumbar fusion is again noted at L4-5. There is moderate central
canal stenosis at the L3-4 level. Slight disc bulging is present at
L1-2. There is mild subarticular narrowing at L2-3 and moderate
subarticular narrowing at L3-4.

Slight retrolisthesis is present at L3-4. This does not change with
standing, flexion, or extension. The disc disease at L2-3 and L3-4
is worse with standing.

Atherosclerotic changes are noted in the aorta. Remote L1 fracture
is stable

CT LUMBAR MYELOGRAM FINDINGS:

Lumbar spine is imaged from T12 through S2-3.

Remote superior endplate fractures present at L1. Atherosclerotic
changes are noted in the aorta. There is no aneurysm. An exophytic
lesion off the posterior aspect of the right kidney demonstrates
intermediate density. This appears cystic on the MRI scan. No other
focal lesions are present. There is no significant adenopathy.

T12-L1: A broad-based disc protrusion effaces the ventral CSF and
potentially contacts the distal cord. The foramina are patent.

L1-2: Negative.

L2-3: A broad-based disc protrusion is asymmetric to the right. Mild
facet hypertrophy and ligamentum flavum thickening is noted. The
disc extends into the right neural foramen without significant
stenosis.

L3-4: A broad-based disc protrusion is asymmetric to the right.
Moderate facet hypertrophy and ligamentum flavum thickening is
noted. Moderate right subarticular narrowing has progressed. Disc
extends into the foramina bilaterally. Moderate right and mild left
foraminal narrowing is present.

L4-5: Solid fusion is present. Bilateral laminectomies are noted
without residual recurrent stenosis.

L5-S1: Mild disc bulging is present. There is no significant
stenosis.
IMPRESSION: 1. Solid fusion at L4-5 without residual or recurrent stenosis.
2. Adjacent level disease at L3-4 with moderate right subarticular
and foraminal stenosis. Mild left foraminal narrowing is present.
3. The L3-4 lumbar disc protrusion is exaggerated with standing.
4. Slight retrolisthesis at L3-4 does not change significantly with
standing, flexion, or extension.
5. More mild broad-based disc protrusion at L2-3 is also worse with
standing.
6.  Aortic Atherosclerosis ([id number]).

## 2023-10-04 ENCOUNTER — Encounter (HOSPITAL_BASED_OUTPATIENT_CLINIC_OR_DEPARTMENT_OTHER): Payer: Self-pay

## 2023-10-04 ENCOUNTER — Other Ambulatory Visit (HOSPITAL_BASED_OUTPATIENT_CLINIC_OR_DEPARTMENT_OTHER): Payer: Self-pay

## 2023-10-04 ENCOUNTER — Ambulatory Visit (HOSPITAL_BASED_OUTPATIENT_CLINIC_OR_DEPARTMENT_OTHER)
Admission: EM | Admit: 2023-10-04 | Discharge: 2023-10-04 | Disposition: A | Discharge status: Home / Self Care | Attending: Family Medicine | Admitting: Family Medicine

## 2023-10-04 ENCOUNTER — Ambulatory Visit (INDEPENDENT_AMBULATORY_CARE_PROVIDER_SITE_OTHER)
Admit: 2023-10-04 | Discharge: 2023-10-04 | Disposition: A | Discharge status: Home / Self Care | Attending: Family Medicine | Admitting: Family Medicine

## 2023-10-04 DIAGNOSIS — R0602 Shortness of breath: Secondary | ICD-10-CM

## 2023-10-04 MED ORDER — ALBUTEROL SULFATE HFA 108 (90 BASE) MCG/ACT IN AERS
1.0000 | INHALATION_SPRAY | Freq: Four times a day (QID) | RESPIRATORY_TRACT | 0 refills | Status: DC | PRN
  Filled 2023-10-04: qty 6.7, 17d supply, fill #0

## 2023-10-04 MED ORDER — ALBUTEROL SULFATE (2.5 MG/3ML) 0.083% IN NEBU
2.5000 mg | INHALATION_SOLUTION | Freq: Four times a day (QID) | RESPIRATORY_TRACT | 12 refills | Status: DC | PRN
  Filled 2023-10-04: qty 75, 7d supply, fill #0

## 2023-10-04 NOTE — ED Provider Notes (Signed)
 [facility]    CSN: [id number] Arrival date & time: 10/04/23  0943      History   Chief Complaint Chief Complaint  Patient presents with   Shortness of Breath    HPI Bryan Robinson is a 68 y.o. male.   68 year old male presents today with shortness of breath.  Was diagnosed with COVID approximate 2 weeks ago.  Has been through 2 rounds of antibiotics and steroid along with steroid injection and concerned due to shortness of breath.  He is just overall still fatigued.  Current everyday smoker.  He has used albuterol  with minimal relief.   Shortness of Breath   Past Medical History:  Diagnosis Date   Arthritis    Atrial flutter, paroxysmal (HCC)    Cancer (HCC)    skin cancers   Depression    Hypertension     Patient Active Problem List   Diagnosis Date Noted   Spondylolisthesis of lumbar region 03/01/2017    Past Surgical History:  Procedure Laterality Date   COLONOSCOPY W/ POLYPECTOMY     SKIN CANCER EXCISION     nose       Home Medications    Prior to Admission medications   Medication Sig Start Date End Date Taking? Authorizing Provider  albuterol  (PROVENTIL ) (2.5 MG/3ML) 0.083% nebulizer solution Take 3 mLs (2.5 mg total) by nebulization every 6 (six) hours as needed for wheezing or shortness of breath. 10/04/23  Yes Crissy Mccreadie A, FNP  albuterol  (VENTOLIN  HFA) 108 (90 Base) MCG/ACT inhaler Inhale 1-2 puffs into the lungs every 6 (six) hours as needed for wheezing or shortness of breath. 10/04/23  Yes Talitha Dicarlo A, FNP  albuterol  (PROVENTIL ) (2.5 MG/3ML) 0.083% nebulizer solution Take 2.5 mg by nebulization every 6 (six) hours as needed for wheezing or shortness of breath.    [provider]  atorvastatin  (LIPITOR) 20 MG tablet Take 20 mg by mouth daily. 10/10/16   [provider]  citalopram  (CELEXA ) 40 MG tablet Take 40 mg by mouth daily. 10/03/16   [provider]  hydroxypropyl methylcellulose / hypromellose (ISOPTO  TEARS / GONIOVISC) 2.5 % ophthalmic solution Place 1 drop into both eyes daily as needed for dry eyes.    [provider]  lisinopril -hydrochlorothiazide  (PRINZIDE ,ZESTORETIC ) 20-12.5 MG tablet Take 1 tablet by mouth daily. 10/03/16   [provider]  metoprolol  succinate (TOPROL -XL) 50 MG 24 hr tablet Take 50 mg by mouth daily. 03/29/16   [provider]  oxyCODONE  (ROXICODONE ) 30 MG immediate release tablet Take 1 tablet (30 mg total) by mouth every 4 (four) hours as needed for severe pain. 03/02/17   Garry Kansas, MD  testosterone  cypionate (DEPOTESTOSTERONE CYPIONATE) 200 MG/ML injection Inject 1 mL into the muscle once a week. 01/11/17   [provider]    Family History History reviewed. No pertinent family history.  Social History Social History   Tobacco Use   Smoking status: Every Day   Smokeless tobacco: Never  Vaping Use   Vaping status: Never Used  Substance Use Topics   Alcohol  use: No   Drug use: No     Allergies   Patient has no known allergies.   Review of Systems Review of Systems  Respiratory:  Positive for shortness of breath.      Physical Exam Triage Vital Signs ED Triage Vitals [10/04/23 0955]  Encounter Vitals Group     BP 124/73     Systolic BP Percentile  Diastolic BP Percentile      Pulse      Resp 20     Temp 98.3 F (36.8 C)     Temp Source Oral     SpO2 98 %     Weight      Height      Head Circumference      Peak Flow      Pain Score 6     Pain Loc      Pain Education      Exclude from Growth Chart    No data found.  Updated Vital Signs BP 124/73 (BP Location: Right Arm)   Temp 98.3 F (36.8 C) (Oral)   Resp 20   SpO2 98%   Visual Acuity Right Eye Distance:   Left Eye Distance:   Bilateral Distance:    Right Eye Near:   Left Eye Near:    Bilateral Near:     Physical Exam Vitals and nursing note reviewed.  Constitutional:      General: He is not in acute distress.     Appearance: Normal appearance. He is not ill-appearing.  Pulmonary:     Effort: Pulmonary effort is normal. No tachypnea.     Breath sounds: Examination of the right-upper field reveals wheezing. Examination of the left-upper field reveals wheezing. Examination of the right-middle field reveals wheezing. Examination of the left-middle field reveals wheezing. Examination of the right-lower field reveals wheezing. Examination of the left-lower field reveals wheezing. Decreased breath sounds and wheezing present.  Musculoskeletal:        General: Normal range of motion.  Neurological:     Mental Status: He is alert.  Psychiatric:        Mood and Affect: Mood normal.      UC Treatments / Results  Labs (all labs ordered are listed, but only abnormal results are displayed) Labs Reviewed - No data to display  EKG   Radiology DG Chest 2 View Result Date: 10/04/2023 CLINICAL DATA:  Shortness of breath. EXAM: CHEST - 2 VIEW COMPARISON:  10/09/2015. FINDINGS: The heart size and mediastinal contours are within normal limits. No focal consolidation, pleural effusion, or pneumothorax. Degenerative changes of the thoracic spine. No acute osseous abnormality. IMPRESSION: No acute cardiopulmonary findings. Electronically Signed   By: Mannie Seek M.D.   On: 10/04/2023 10:33    Procedures Procedures (including critical care time)  Medications Ordered in UC Medications - No data to display  Initial Impression / Assessment and Plan / UC Course  I have reviewed the triage vital signs and the nursing notes.  Pertinent labs & imaging results that were available during my care of the patient were reviewed by me and considered in my medical decision making (see chart for details).    Shortness of breath-fine wheezes on exam.  No concerns on x-ray reading as normal.  Believe this could be some post-COVID symptoms versus anxiety.  Recommended magnesium and ashwagandha supplement daily. I have  refilled his albuterol  inhaler and nebulizer solution to use as needed for cough, wheezing or shortness of breath. Follow-up with PCP for continued issues  Final Clinical Impressions(s) / UC Diagnoses   Final diagnoses:  Shortness of breath     Discharge Instructions      You x ray was normal. I believe this could be post COVID symptoms and maybe some anxiety. Try to fin ways to calm such as taking magnesium and Ashwagandha. Go for short walks.  I am sending in a  new inhaler and nebulizer solution. Follow up with your doctor for continued symptoms   ED Prescriptions     Medication Sig Dispense Auth. Provider   albuterol  (VENTOLIN  HFA) 108 (90 Base) MCG/ACT inhaler Inhale 1-2 puffs into the lungs every 6 (six) hours as needed for wheezing or shortness of breath. 6.7 g Talea Manges A, FNP   albuterol  (PROVENTIL ) (2.5 MG/3ML) 0.083% nebulizer solution Take 3 mLs (2.5 mg total) by nebulization every 6 (six) hours as needed for wheezing or shortness of breath. 75 mL Jerri Morale A, FNP      PDMP not reviewed this encounter.   Landa Pine, FNP 10/04/23 1120

## 2023-10-04 NOTE — ED Triage Notes (Signed)
 Patient was diagnosed with Covid 2 weeks ago. States was prescribed Levaquin and prednisone and has finished scripts. Still having shortness of breath and productive cough. States was 5 days off of cigarettes when he was diagnosed with covid and got so scared that he started smoking again. 1ppd smoker

## 2023-10-04 NOTE — Discharge Instructions (Signed)
 You x ray was normal. I believe this could be post COVID symptoms and maybe some anxiety. Try to fin ways to calm such as taking magnesium and Ashwagandha. Go for short walks.  I am sending in a new inhaler and nebulizer solution. Follow up with your doctor for continued symptoms

## 2023-10-21 ENCOUNTER — Encounter (HOSPITAL_BASED_OUTPATIENT_CLINIC_OR_DEPARTMENT_OTHER): Payer: Self-pay | Admitting: Emergency Medicine

## 2023-10-21 ENCOUNTER — Ambulatory Visit (HOSPITAL_BASED_OUTPATIENT_CLINIC_OR_DEPARTMENT_OTHER)
Admit: 2023-10-21 | Discharge: 2023-10-21 | Disposition: A | Discharge status: Home / Self Care | Attending: Family Medicine | Admitting: Radiology

## 2023-10-21 ENCOUNTER — Ambulatory Visit (HOSPITAL_BASED_OUTPATIENT_CLINIC_OR_DEPARTMENT_OTHER)
Admission: EM | Admit: 2023-10-21 | Discharge: 2023-10-21 | Disposition: A | Discharge status: Home / Self Care | Attending: Family Medicine | Admitting: Family Medicine

## 2023-10-21 DIAGNOSIS — R112 Nausea with vomiting, unspecified: Secondary | ICD-10-CM

## 2023-10-21 DIAGNOSIS — R1084 Generalized abdominal pain: Secondary | ICD-10-CM

## 2023-10-21 MED ORDER — PROMETHAZINE HCL 12.5 MG PO TABS: 12.5000 mg | ORAL_TABLET | Freq: Four times a day (QID) | ORAL | 0 refills | Status: DC | PRN

## 2023-10-21 MED ORDER — ONDANSETRON HCL 4 MG/2ML IJ SOLN
4.0000 mg | Freq: Once | INTRAMUSCULAR | Status: AC
  Administered 2023-10-21: 4 mg via INTRAMUSCULAR

## 2023-10-21 NOTE — ED Triage Notes (Signed)
 Pt reports vomiting started last night he was diagnosed with Covid 4 weeks ago.

## 2023-10-21 NOTE — Discharge Instructions (Addendum)
 Nausea and vomiting: Patient had's significant relief after ondansetron  injection.  Provided promethazine 12.5 mg tablets, 1 tablet every 6 hours as needed for nausea and vomiting.  Next tablet may be taken at 4:00 PM today.  After that tablets are every 6 hours.  Provided handout on oral rehydration solutions.  Provided handout on rehydration and preventing dehydration.  It is okay not to eat a lot of solid food for the next few days but patient needs to get plenty of fluids.  Abdominal x-ray appears negative but does show some stool and bowel gas.  May use Zantac for acid reflux if needed.  If abdominal pain persists and does not resolve.  Follow-up here or go to an emergency room.  Emergency room might be a good choice because she could need a CT scan to check on causes of his abdominal pain.

## 2023-10-21 NOTE — ED Provider Notes (Signed)
 Bryan Robinson    CSN: [id number] Arrival date & time: 10/21/23  1118      History   Chief Complaint Chief Complaint  Patient presents with   Emesis    HPI Bryan Robinson is a 68 y.o. male.   Patient reports that he has had COVID-19 infection with respiratory symptoms for approximately a month (approximately on 09/20/23).  Per the patient's and his wife's report, this is his fifth urgent Robinson visit including the diagnosis.  They report they were seen at [facility] urgent [facility] 3 times, were seen here on 10/04/2023 and here again today.  He had nausea and vomiting off and on for approximately 8 hours on 10/15/2023.  And he has had nausea and vomiting since about 2 AM this morning.  He has had dry heaves.  He normally has some mild belly pain and Zantac really helps that belly pain.  But he tried Zantac several times and just vomited it right back up.   Emesis Associated symptoms: abdominal pain   Associated symptoms: no arthralgias, no chills, no cough, no diarrhea, no fever and no sore throat     Past Medical History:  Diagnosis Date   Arthritis    Atrial flutter, paroxysmal (HCC)    Cancer (HCC)    skin cancers   Depression    Hypertension     Patient Active Problem List   Diagnosis Date Noted   Spondylolisthesis of lumbar region 03/01/2017    Past Surgical History:  Procedure Laterality Date   COLONOSCOPY W/ POLYPECTOMY     SKIN CANCER EXCISION     nose       Home Medications    Prior to Admission medications   Medication Sig Start Date End Date Taking? Authorizing Provider  lisinopril -hydrochlorothiazide  (PRINZIDE ,ZESTORETIC ) 20-12.5 MG tablet Take 1 tablet by mouth daily. 10/03/16  Yes [provider]  metoprolol  succinate (TOPROL -XL) 50 MG 24 hr tablet Take 50 mg by mouth daily. 03/29/16  Yes [provider]  promethazine (PHENERGAN) 12.5 MG tablet Take 1 tablet (12.5 mg total) by mouth every 6 (six) hours as needed for nausea or  vomiting. 10/21/23  Yes Guss Legacy, FNP  albuterol  (PROVENTIL ) (2.5 MG/3ML) 0.083% nebulizer solution Take 2.5 mg by nebulization every 6 (six) hours as needed for wheezing or shortness of breath.    [provider]  albuterol  (PROVENTIL ) (2.5 MG/3ML) 0.083% nebulizer solution Take 3 mLs (2.5 mg total) by nebulization every 6 (six) hours as needed for wheezing or shortness of breath. 10/04/23   Landa Pine, FNP  albuterol  (VENTOLIN  HFA) 108 (90 Base) MCG/ACT inhaler Inhale 1-2 puffs into the lungs every 6 (six) hours as needed for wheezing or shortness of breath. 10/04/23   Jerri Morale A, FNP  atorvastatin  (LIPITOR) 20 MG tablet Take 20 mg by mouth daily. 10/10/16   [provider]  citalopram  (CELEXA ) 40 MG tablet Take 40 mg by mouth daily. 10/03/16   [provider]  hydroxypropyl methylcellulose / hypromellose (ISOPTO TEARS / GONIOVISC) 2.5 % ophthalmic solution Place 1 drop into both eyes daily as needed for dry eyes.    [provider]  oxyCODONE  (ROXICODONE ) 30 MG immediate release tablet Take 1 tablet (30 mg total) by mouth every 4 (four) hours as needed for severe pain. 03/02/17   Garry Kansas, MD  testosterone  cypionate (DEPOTESTOSTERONE CYPIONATE) 200 MG/ML injection Inject 1 mL into the muscle once a week. 01/11/17   [provider]    Family  History History reviewed. No pertinent family history.  Social History Social History   Tobacco Use   Smoking status: Every Day   Smokeless tobacco: Never  Vaping Use   Vaping status: Never Used  Substance Use Topics   Alcohol  use: No   Drug use: No     Allergies   Patient has no known allergies.   Review of Systems Review of Systems  Constitutional:  Negative for chills and fever.  HENT:  Negative for ear pain and sore throat.   Eyes:  Negative for pain and visual disturbance.  Respiratory:  Negative for cough.   Cardiovascular:  Negative for chest pain and palpitations.   Gastrointestinal:  Positive for abdominal pain, nausea and vomiting. Negative for constipation and diarrhea.  Genitourinary:  Negative for dysuria and hematuria.  Musculoskeletal:  Negative for arthralgias and back pain.  Skin:  Negative for color change and rash.  Neurological:  Negative for seizures and syncope.  All other systems reviewed and are negative.    Physical Exam Triage Vital Signs ED Triage Vitals  Encounter Vitals Group     BP 10/21/23 1132 (!) 151/78     Systolic BP Percentile --      Diastolic BP Percentile --      Pulse Rate 10/21/23 1132 (!) 45     Resp 10/21/23 1132 18     Temp 10/21/23 1132 98.2 F (36.8 C)     Temp Source 10/21/23 1132 Oral     SpO2 10/21/23 1132 98 %     Weight --      Height --      Head Circumference --      Peak Flow --      Pain Score 10/21/23 1131 8     Pain Loc --      Pain Education --      Exclude from Growth Chart --    No data found.  Updated Vital Signs BP (!) 151/78 (BP Location: Right Arm)   Pulse (!) 45   Temp 98.2 F (36.8 C) (Oral)   Resp 18   SpO2 98%   Visual Acuity Right Eye Distance:   Left Eye Distance:   Bilateral Distance:    Right Eye Near:   Left Eye Near:    Bilateral Near:     Physical Exam Vitals and nursing note reviewed.  Constitutional:      General: He is not in acute distress.    Appearance: He is well-developed. He is not ill-appearing or toxic-appearing.  HENT:     Head: Normocephalic and atraumatic.     Right Ear: Hearing, tympanic membrane, ear canal and external ear normal.     Left Ear: Hearing, tympanic membrane, ear canal and external ear normal.     Nose: No congestion or rhinorrhea.     Right Sinus: No maxillary sinus tenderness or frontal sinus tenderness.     Left Sinus: No maxillary sinus tenderness or frontal sinus tenderness.     Mouth/Throat:     Lips: Pink.     Mouth: Mucous membranes are moist.     Pharynx: Uvula midline. No oropharyngeal exudate or posterior  oropharyngeal erythema.     Tonsils: No tonsillar exudate.  Eyes:     Conjunctiva/sclera: Conjunctivae normal.     Pupils: Pupils are equal, round, and reactive to light.  Cardiovascular:     Rate and Rhythm: Normal rate and regular rhythm.     Heart sounds: S1 normal and S2 normal.  No murmur heard. Pulmonary:     Effort: Pulmonary effort is normal. No respiratory distress.     Breath sounds: Normal breath sounds. No decreased breath sounds, wheezing, rhonchi or rales.  Abdominal:     General: Bowel sounds are normal.     Palpations: Abdomen is soft.     Tenderness: There is generalized abdominal tenderness (Mild).  Musculoskeletal:        General: No swelling.     Cervical back: Neck supple.  Lymphadenopathy:     Head:     Right side of head: No submental, submandibular, tonsillar, preauricular or posterior auricular adenopathy.     Left side of head: No submental, submandibular, tonsillar, preauricular or posterior auricular adenopathy.     Cervical: No cervical adenopathy.     Right cervical: No superficial cervical adenopathy.    Left cervical: No superficial cervical adenopathy.  Skin:    General: Skin is warm and dry.     Capillary Refill: Capillary refill takes less than 2 seconds.     Findings: No rash.  Neurological:     Mental Status: He is alert and oriented to person, place, and time.  Psychiatric:        Mood and Affect: Mood normal.      UC Treatments / Results  Labs (all labs ordered are listed, but only abnormal results are displayed) Labs Reviewed - No data to display  EKG   Radiology No results found.  Procedures Procedures (including critical Robinson time)  Medications Ordered in UC Medications  ondansetron  (ZOFRAN ) injection 4 mg (4 mg Intramuscular Given 10/21/23 1150)    Initial Impression / Assessment and Plan / UC Course  I have reviewed the triage vital signs and the nursing notes.  Pertinent labs & imaging results that were available  during my Robinson of the patient were reviewed by me and considered in my medical decision making (see chart for details).  Plan of Robinson: Nausea and vomiting with abdominal pain: Huge relief of nausea with ondansetron  shot.  Advised about oral rehydration solutions with recipe handout given.  Encouraged general rehydration.  Promethazine 12.5 mg tablet, once every 6 hours as needed for nausea and vomiting.  See discharge instructions for more information.  Abdominal x-ray shows some stool and gas but is otherwise normal.  May use Zantac OTC, if that is helpful for his abdominal pain.  Follow-up if symptoms do not improve, worsen or new symptoms occur.  I reviewed the plan of Robinson with the patient and/or the patient's guardian.  The patient and/or guardian had time to ask questions and acknowledged that the questions were answered.  I provided instruction on symptoms or reasons to return here or to go to an ER, if symptoms/condition did not improve, worsened or if new symptoms occurred.  Final Clinical Impressions(s) / UC Diagnoses   Final diagnoses:  Nausea and vomiting, unspecified vomiting type  Generalized abdominal pain     Discharge Instructions      Nausea and vomiting: Patient had's significant relief after ondansetron  injection.  Provided promethazine 12.5 mg tablets, 1 tablet every 6 hours as needed for nausea and vomiting.  Next tablet may be taken at 4:00 PM today.  After that tablets are every 6 hours.  Provided handout on oral rehydration solutions.  Provided handout on rehydration and preventing dehydration.  It is okay not to eat a lot of solid food for the next few days but patient needs to get plenty of fluids.  Abdominal  x-ray appears negative but does show some stool and bowel gas.  May use Zantac for acid reflux if needed.  If abdominal pain persists and does not resolve.  Follow-up here or go to an emergency room.  Emergency room might be a good choice because she could need  a CT scan to check on causes of his abdominal pain.   ED Prescriptions     Medication Sig Dispense Auth. Provider   promethazine (PHENERGAN) 12.5 MG tablet Take 1 tablet (12.5 mg total) by mouth every 6 (six) hours as needed for nausea or vomiting. 24 tablet Bryan Teicher, FNP      PDMP not reviewed this encounter.   Guss Legacy, FNP 10/21/23 1248

## 2023-10-22 ENCOUNTER — Ambulatory Visit (HOSPITAL_BASED_OUTPATIENT_CLINIC_OR_DEPARTMENT_OTHER): Payer: Self-pay | Admitting: Family Medicine

## 2023-10-22 NOTE — Progress Notes (Signed)
 Abdominal x-ray shows some gas and stool but is otherwise normal.  Patient was given this update during the visit.  Follow-up as needed.

## 2023-12-22 DEATH — deceased
